# Patient Record
Sex: Male | Born: 1935 | Race: White | Hispanic: No | Marital: Married | Smoking: Former smoker
Health system: Southern US, Community
[De-identification: ages and names within clinical notes are randomized; demographics above are authoritative.]

## PROBLEM LIST (undated history)

## (undated) DIAGNOSIS — K219 Gastro-esophageal reflux disease without esophagitis: Secondary | ICD-10-CM

## (undated) DIAGNOSIS — J309 Allergic rhinitis, unspecified: Secondary | ICD-10-CM

## (undated) DIAGNOSIS — Z9079 Acquired absence of other genital organ(s): Secondary | ICD-10-CM

## (undated) DIAGNOSIS — R0602 Shortness of breath: Secondary | ICD-10-CM

## (undated) DIAGNOSIS — N400 Enlarged prostate without lower urinary tract symptoms: Secondary | ICD-10-CM

## (undated) DIAGNOSIS — N200 Calculus of kidney: Secondary | ICD-10-CM

## (undated) DIAGNOSIS — H269 Unspecified cataract: Secondary | ICD-10-CM

## (undated) DIAGNOSIS — R5383 Other fatigue: Secondary | ICD-10-CM

## (undated) DIAGNOSIS — E785 Hyperlipidemia, unspecified: Secondary | ICD-10-CM

## (undated) DIAGNOSIS — H9190 Unspecified hearing loss, unspecified ear: Secondary | ICD-10-CM

## (undated) DIAGNOSIS — E739 Lactose intolerance, unspecified: Secondary | ICD-10-CM

## (undated) DIAGNOSIS — R5381 Other malaise: Secondary | ICD-10-CM

## (undated) DIAGNOSIS — R350 Frequency of micturition: Secondary | ICD-10-CM

## (undated) DIAGNOSIS — N4340 Spermatocele of epididymis, unspecified: Secondary | ICD-10-CM

## (undated) DIAGNOSIS — R7302 Impaired glucose tolerance (oral): Secondary | ICD-10-CM

## (undated) DIAGNOSIS — K259 Gastric ulcer, unspecified as acute or chronic, without hemorrhage or perforation: Secondary | ICD-10-CM

## (undated) DIAGNOSIS — I700 Atherosclerosis of aorta: Secondary | ICD-10-CM

## (undated) DIAGNOSIS — T7840XA Allergy, unspecified, initial encounter: Secondary | ICD-10-CM

## (undated) DIAGNOSIS — F528 Other sexual dysfunction not due to a substance or known physiological condition: Secondary | ICD-10-CM

## (undated) DIAGNOSIS — Z923 Personal history of irradiation: Secondary | ICD-10-CM

## (undated) DIAGNOSIS — Z8719 Personal history of other diseases of the digestive system: Secondary | ICD-10-CM

## (undated) DIAGNOSIS — I10 Essential (primary) hypertension: Secondary | ICD-10-CM

## (undated) DIAGNOSIS — Z87442 Personal history of urinary calculi: Secondary | ICD-10-CM

## (undated) DIAGNOSIS — R0789 Other chest pain: Secondary | ICD-10-CM

## (undated) HISTORY — DX: Personal history of urinary calculi: Z87.442

## (undated) HISTORY — PX: INGUINAL HERNIA REPAIR: SUR1180

## (undated) HISTORY — DX: Essential (primary) hypertension: I10

## (undated) HISTORY — DX: Allergic rhinitis, unspecified: J30.9

## (undated) HISTORY — DX: Impaired glucose tolerance (oral): R73.02

## (undated) HISTORY — DX: Shortness of breath: R06.02

## (undated) HISTORY — DX: Lactose intolerance, unspecified: E73.9

## (undated) HISTORY — PX: TONSILLECTOMY: SUR1361

## (undated) HISTORY — PX: LITHOTRIPSY: SUR834

## (undated) HISTORY — DX: Benign prostatic hyperplasia without lower urinary tract symptoms: N40.0

## (undated) HISTORY — DX: Acquired absence of other genital organ(s): Z90.79

## (undated) HISTORY — PX: UPPER GI ENDOSCOPY: SHX6162

## (undated) HISTORY — DX: Other malaise: R53.81

## (undated) HISTORY — DX: Gastric ulcer, unspecified as acute or chronic, without hemorrhage or perforation: K25.9

## (undated) HISTORY — DX: Personal history of other diseases of the digestive system: Z87.19

## (undated) HISTORY — PX: SKIN LESION EXCISION: SHX2412

## (undated) HISTORY — DX: Spermatocele of epididymis, unspecified: N43.40

## (undated) HISTORY — PX: CYSTOSCOPY: SUR368

## (undated) HISTORY — DX: Other fatigue: R53.83

## (undated) HISTORY — DX: Hyperlipidemia, unspecified: E78.5

## (undated) HISTORY — DX: Other sexual dysfunction not due to a substance or known physiological condition: F52.8

## (undated) HISTORY — DX: Allergy, unspecified, initial encounter: T78.40XA

## (undated) HISTORY — DX: Frequency of micturition: R35.0

## (undated) HISTORY — PX: COLONOSCOPY: SHX174

## (undated) HISTORY — PX: OTHER SURGICAL HISTORY: SHX169

## (undated) HISTORY — DX: Calculus of kidney: N20.0

## (undated) HISTORY — DX: Gastro-esophageal reflux disease without esophagitis: K21.9

## (undated) HISTORY — DX: Other chest pain: R07.89

---

## 1998-09-26 ENCOUNTER — Ambulatory Visit (HOSPITAL_BASED_OUTPATIENT_CLINIC_OR_DEPARTMENT_OTHER): Admission: RE | Admit: 1998-09-26 | Discharge: 1998-09-26 | Payer: Self-pay | Admitting: Urology

## 2004-06-30 ENCOUNTER — Encounter (INDEPENDENT_AMBULATORY_CARE_PROVIDER_SITE_OTHER): Payer: Self-pay | Admitting: Specialist

## 2004-06-30 ENCOUNTER — Ambulatory Visit (HOSPITAL_COMMUNITY): Admission: RE | Admit: 2004-06-30 | Discharge: 2004-06-30 | Payer: Self-pay | Admitting: Urology

## 2004-06-30 ENCOUNTER — Ambulatory Visit (HOSPITAL_BASED_OUTPATIENT_CLINIC_OR_DEPARTMENT_OTHER): Admission: RE | Admit: 2004-06-30 | Discharge: 2004-06-30 | Payer: Self-pay | Admitting: Urology

## 2004-12-20 ENCOUNTER — Emergency Department (HOSPITAL_COMMUNITY): Admission: EM | Admit: 2004-12-20 | Discharge: 2004-12-20 | Payer: Self-pay | Admitting: Emergency Medicine

## 2005-09-28 ENCOUNTER — Ambulatory Visit (HOSPITAL_COMMUNITY): Admission: RE | Admit: 2005-09-28 | Discharge: 2005-09-28 | Payer: Self-pay | Admitting: Urology

## 2005-09-29 ENCOUNTER — Observation Stay (HOSPITAL_COMMUNITY): Admission: EM | Admit: 2005-09-29 | Discharge: 2005-09-30 | Payer: Self-pay | Admitting: *Deleted

## 2005-10-02 ENCOUNTER — Ambulatory Visit (HOSPITAL_COMMUNITY): Admission: RE | Admit: 2005-10-02 | Discharge: 2005-10-02 | Payer: Self-pay | Admitting: Urology

## 2005-10-02 ENCOUNTER — Ambulatory Visit (HOSPITAL_BASED_OUTPATIENT_CLINIC_OR_DEPARTMENT_OTHER): Admission: RE | Admit: 2005-10-02 | Discharge: 2005-10-02 | Payer: Self-pay | Admitting: Urology

## 2005-10-12 ENCOUNTER — Ambulatory Visit (HOSPITAL_COMMUNITY): Admission: RE | Admit: 2005-10-12 | Discharge: 2005-10-12 | Payer: Self-pay | Admitting: Urology

## 2006-04-20 ENCOUNTER — Ambulatory Visit: Payer: Self-pay | Admitting: Internal Medicine

## 2006-10-20 ENCOUNTER — Ambulatory Visit: Payer: Self-pay | Admitting: Internal Medicine

## 2006-10-20 LAB — CONVERTED CEMR LAB: PSA: 0.75 ng/mL

## 2006-11-12 ENCOUNTER — Ambulatory Visit (HOSPITAL_BASED_OUTPATIENT_CLINIC_OR_DEPARTMENT_OTHER): Admission: RE | Admit: 2006-11-12 | Discharge: 2006-11-12 | Payer: Self-pay | Admitting: Otolaryngology

## 2006-11-12 ENCOUNTER — Encounter (INDEPENDENT_AMBULATORY_CARE_PROVIDER_SITE_OTHER): Payer: Self-pay | Admitting: Specialist

## 2006-12-03 ENCOUNTER — Ambulatory Visit (HOSPITAL_BASED_OUTPATIENT_CLINIC_OR_DEPARTMENT_OTHER): Admission: RE | Admit: 2006-12-03 | Discharge: 2006-12-03 | Payer: Self-pay | Admitting: General Surgery

## 2006-12-03 ENCOUNTER — Encounter (INDEPENDENT_AMBULATORY_CARE_PROVIDER_SITE_OTHER): Payer: Self-pay | Admitting: Specialist

## 2007-04-18 ENCOUNTER — Ambulatory Visit: Payer: Self-pay | Admitting: Internal Medicine

## 2007-04-18 LAB — CONVERTED CEMR LAB
ALT: 16 units/L (ref 0–40)
AST: 17 units/L (ref 0–37)
Albumin: 3.9 g/dL (ref 3.5–5.2)
Alkaline Phosphatase: 91 units/L (ref 39–117)
Bilirubin, Direct: 0.2 mg/dL (ref 0.0–0.3)
Cholesterol: 172 mg/dL (ref 0–200)
HDL: 48.7 mg/dL (ref >39.0)
LDL Cholesterol: 113 mg/dL — ABNORMAL HIGH (ref 0–99)
Total Bilirubin: 0.9 mg/dL (ref 0.3–1.2)
Total CHOL/HDL Ratio: 3.5
Total Protein: 6.4 g/dL (ref 6.0–8.3)
Triglycerides: 51 mg/dL (ref 0–149)
VLDL: 10 mg/dL (ref 0–40)

## 2007-08-13 ENCOUNTER — Encounter: Payer: Self-pay | Admitting: Internal Medicine

## 2007-08-13 DIAGNOSIS — N200 Calculus of kidney: Secondary | ICD-10-CM | POA: Insufficient documentation

## 2007-08-13 DIAGNOSIS — Z8719 Personal history of other diseases of the digestive system: Secondary | ICD-10-CM

## 2007-08-13 DIAGNOSIS — T7840XA Allergy, unspecified, initial encounter: Secondary | ICD-10-CM | POA: Insufficient documentation

## 2007-08-13 DIAGNOSIS — N4340 Spermatocele of epididymis, unspecified: Secondary | ICD-10-CM

## 2007-08-13 DIAGNOSIS — I10 Essential (primary) hypertension: Secondary | ICD-10-CM

## 2007-08-13 DIAGNOSIS — K4020 Bilateral inguinal hernia, without obstruction or gangrene, not specified as recurrent: Secondary | ICD-10-CM | POA: Insufficient documentation

## 2007-08-13 HISTORY — DX: Personal history of other diseases of the digestive system: Z87.19

## 2007-08-13 HISTORY — DX: Essential (primary) hypertension: I10

## 2007-08-13 HISTORY — DX: Calculus of kidney: N20.0

## 2007-08-13 HISTORY — DX: Spermatocele of epididymis, unspecified: N43.40

## 2007-10-19 ENCOUNTER — Ambulatory Visit: Payer: Self-pay | Admitting: Internal Medicine

## 2007-10-19 DIAGNOSIS — J309 Allergic rhinitis, unspecified: Secondary | ICD-10-CM

## 2007-10-19 DIAGNOSIS — Z87442 Personal history of urinary calculi: Secondary | ICD-10-CM

## 2007-10-19 DIAGNOSIS — N400 Enlarged prostate without lower urinary tract symptoms: Secondary | ICD-10-CM

## 2007-10-19 DIAGNOSIS — F528 Other sexual dysfunction not due to a substance or known physiological condition: Secondary | ICD-10-CM | POA: Insufficient documentation

## 2007-10-19 DIAGNOSIS — R5383 Other fatigue: Secondary | ICD-10-CM

## 2007-10-19 DIAGNOSIS — E739 Lactose intolerance, unspecified: Secondary | ICD-10-CM

## 2007-10-19 DIAGNOSIS — Z9079 Acquired absence of other genital organ(s): Secondary | ICD-10-CM

## 2007-10-19 DIAGNOSIS — E785 Hyperlipidemia, unspecified: Secondary | ICD-10-CM

## 2007-10-19 DIAGNOSIS — R5381 Other malaise: Secondary | ICD-10-CM

## 2007-10-19 DIAGNOSIS — K219 Gastro-esophageal reflux disease without esophagitis: Secondary | ICD-10-CM | POA: Insufficient documentation

## 2007-10-19 DIAGNOSIS — N138 Other obstructive and reflux uropathy: Secondary | ICD-10-CM | POA: Insufficient documentation

## 2007-10-19 HISTORY — DX: Gastro-esophageal reflux disease without esophagitis: K21.9

## 2007-10-19 HISTORY — DX: Acquired absence of other genital organ(s): Z90.79

## 2007-10-19 HISTORY — DX: Personal history of urinary calculi: Z87.442

## 2007-10-19 HISTORY — DX: Hyperlipidemia, unspecified: E78.5

## 2007-10-19 HISTORY — DX: Other sexual dysfunction not due to a substance or known physiological condition: F52.8

## 2007-10-19 HISTORY — DX: Other malaise: R53.81

## 2007-10-19 HISTORY — DX: Lactose intolerance, unspecified: E73.9

## 2007-10-19 HISTORY — DX: Allergic rhinitis, unspecified: J30.9

## 2007-10-19 HISTORY — DX: Benign prostatic hyperplasia without lower urinary tract symptoms: N40.0

## 2007-10-21 LAB — CONVERTED CEMR LAB
ALT: 16 units/L (ref 0–53)
AST: 16 units/L (ref 0–37)
Albumin: 3.8 g/dL (ref 3.5–5.2)
Alkaline Phosphatase: 88 units/L (ref 39–117)
BUN: 17 mg/dL (ref 6–23)
Basophils Absolute: 0.1 10*3/uL (ref 0.0–0.1)
Basophils Relative: 1.3 % — ABNORMAL HIGH (ref 0.0–1.0)
Bilirubin, Direct: 0.1 mg/dL (ref 0.0–0.3)
CO2: 33 meq/L — ABNORMAL HIGH (ref 19–32)
Calcium: 9.2 mg/dL (ref 8.4–10.5)
Chloride: 105 meq/L (ref 96–112)
Cholesterol: 203 mg/dL (ref 0–200)
Creatinine, Ser: 0.7 mg/dL (ref 0.4–1.5)
Direct LDL: 128.8 mg/dL
Eosinophils Absolute: 0.2 10*3/uL (ref 0.0–0.6)
Eosinophils Relative: 3.6 % (ref 0.0–5.0)
GFR calc Af Amer: 144 mL/min
GFR calc non Af Amer: 119 mL/min
Glucose, Bld: 89 mg/dL (ref 70–99)
HCT: 40.9 % (ref 39.0–52.0)
HDL: 56.7 mg/dL (ref >39.0)
Hemoglobin: 14.5 g/dL (ref 13.0–17.0)
Hgb A1c MFr Bld: 4.9 % (ref 4.6–6.0)
Lymphocytes Relative: 25.6 % (ref 12.0–46.0)
MCHC: 35.4 g/dL (ref 30.0–36.0)
MCV: 90.1 fL (ref 78.0–100.0)
Monocytes Absolute: 0.4 10*3/uL (ref 0.2–0.7)
Monocytes Relative: 7.5 % (ref 3.0–11.0)
Neutro Abs: 3 10*3/uL (ref 1.4–7.7)
Neutrophils Relative %: 62 % (ref 43.0–77.0)
PSA: 0.91 ng/mL (ref 0.10–4.00)
Platelets: 197 10*3/uL (ref 150–400)
Potassium: 4.1 meq/L (ref 3.5–5.1)
RBC: 4.54 M/uL (ref 4.22–5.81)
RDW: 12.5 % (ref 11.5–14.6)
Sodium: 144 meq/L (ref 135–145)
TSH: 1.41 microintl units/mL (ref 0.35–5.50)
Testosterone: 339.48 ng/dL — ABNORMAL LOW (ref 350.00–890)
Total Bilirubin: 0.9 mg/dL (ref 0.3–1.2)
Total CHOL/HDL Ratio: 3.6
Total Protein: 6.4 g/dL (ref 6.0–8.3)
Triglycerides: 52 mg/dL (ref 0–149)
VLDL: 10 mg/dL (ref 0–40)
WBC: 5 10*3/uL (ref 4.5–10.5)

## 2008-04-17 ENCOUNTER — Ambulatory Visit: Payer: Self-pay | Admitting: Internal Medicine

## 2008-05-29 ENCOUNTER — Ambulatory Visit: Payer: Self-pay | Admitting: Internal Medicine

## 2008-06-04 LAB — CONVERTED CEMR LAB
Cholesterol: 173 mg/dL (ref 0–200)
HDL: 46 mg/dL (ref >39.0)
VLDL: 17 mg/dL (ref 0–40)

## 2008-11-20 ENCOUNTER — Ambulatory Visit: Payer: Self-pay | Admitting: Internal Medicine

## 2008-11-20 DIAGNOSIS — R350 Frequency of micturition: Secondary | ICD-10-CM

## 2008-11-20 DIAGNOSIS — R0602 Shortness of breath: Secondary | ICD-10-CM

## 2008-11-20 HISTORY — DX: Frequency of micturition: R35.0

## 2008-11-20 HISTORY — DX: Shortness of breath: R06.02

## 2008-11-20 LAB — CONVERTED CEMR LAB
ALT: 17 units/L (ref 0–53)
AST: 18 units/L (ref 0–37)
Albumin: 4.1 g/dL (ref 3.5–5.2)
Alkaline Phosphatase: 108 units/L (ref 39–117)
BUN: 13 mg/dL (ref 6–23)
Bacteria, UA: NEGATIVE
Basophils Absolute: 0.1 10*3/uL (ref 0.0–0.1)
Basophils Relative: 0.9 % (ref 0.0–3.0)
Bilirubin Urine: NEGATIVE
Bilirubin, Direct: 0.2 mg/dL (ref 0.0–0.3)
CO2: 33 meq/L — ABNORMAL HIGH (ref 19–32)
Calcium: 9.3 mg/dL (ref 8.4–10.5)
Chloride: 107 meq/L (ref 96–112)
Cholesterol: 173 mg/dL (ref 0–200)
Creatinine, Ser: 0.9 mg/dL (ref 0.4–1.5)
Crystals: NEGATIVE
Eosinophils Absolute: 0.1 10*3/uL (ref 0.0–0.7)
Eosinophils Relative: 2 % (ref 0.0–5.0)
Folate: 19.4 ng/mL
GFR calc Af Amer: 107 mL/min
GFR calc non Af Amer: 89 mL/min
Glucose, Bld: 93 mg/dL (ref 70–99)
HCT: 42 % (ref 39.0–52.0)
HDL: 60.5 mg/dL (ref >39.0)
Hemoglobin, Urine: NEGATIVE
Hemoglobin: 14.8 g/dL (ref 13.0–17.0)
Ketones, ur: NEGATIVE mg/dL
LDL Cholesterol: 101 mg/dL — ABNORMAL HIGH (ref 0–99)
Leukocytes, UA: NEGATIVE
Lymphocytes Relative: 19.9 % (ref 12.0–46.0)
MCHC: 35.1 g/dL (ref 30.0–36.0)
MCV: 91.1 fL (ref 78.0–100.0)
Monocytes Absolute: 0.5 10*3/uL (ref 0.1–1.0)
Monocytes Relative: 7.5 % (ref 3.0–12.0)
Mucus, UA: NEGATIVE
Neutro Abs: 4.1 10*3/uL (ref 1.4–7.7)
Neutrophils Relative %: 69.7 % (ref 43.0–77.0)
Nitrite: NEGATIVE
PSA: 0.91 ng/mL (ref 0.10–4.00)
Platelets: 185 10*3/uL (ref 150–400)
Potassium: 4 meq/L (ref 3.5–5.1)
RBC / HPF: NONE SEEN
RBC: 4.61 M/uL (ref 4.22–5.81)
RDW: 12.6 % (ref 11.5–14.6)
Sed Rate: 3 mm/hr (ref 0–16)
Sodium: 144 meq/L (ref 135–145)
Specific Gravity, Urine: 1.01 (ref 1.000–1.03)
Squamous Epithelial / HPF: NEGATIVE /lpf
TSH: 1.82 microintl units/mL (ref 0.35–5.50)
Total Bilirubin: 0.9 mg/dL (ref 0.3–1.2)
Total CHOL/HDL Ratio: 2.9
Total Protein, Urine: NEGATIVE mg/dL
Total Protein: 6.7 g/dL (ref 6.0–8.3)
Triglycerides: 56 mg/dL (ref 0–149)
Urine Glucose: NEGATIVE mg/dL
Urobilinogen, UA: 0.2 (ref 0.0–1.0)
VLDL: 11 mg/dL (ref 0–40)
Vitamin B-12: 274 pg/mL (ref 211–911)
WBC: 6 10*3/uL (ref 4.5–10.5)
pH: 7 (ref 5.0–8.0)

## 2008-11-21 LAB — CONVERTED CEMR LAB: Vit D, 1,25-Dihydroxy: 32 (ref 30–89)

## 2008-12-03 ENCOUNTER — Encounter: Admission: RE | Admit: 2008-12-03 | Discharge: 2008-12-03 | Payer: Self-pay | Admitting: Pulmonary Disease

## 2008-12-03 ENCOUNTER — Ambulatory Visit: Payer: Self-pay | Admitting: Pulmonary Disease

## 2008-12-03 DIAGNOSIS — R0789 Other chest pain: Secondary | ICD-10-CM | POA: Insufficient documentation

## 2008-12-03 HISTORY — DX: Other chest pain: R07.89

## 2008-12-05 ENCOUNTER — Telehealth (INDEPENDENT_AMBULATORY_CARE_PROVIDER_SITE_OTHER): Payer: Self-pay | Admitting: *Deleted

## 2008-12-12 ENCOUNTER — Ambulatory Visit (HOSPITAL_COMMUNITY): Admission: RE | Admit: 2008-12-12 | Discharge: 2008-12-12 | Payer: Self-pay | Admitting: Pulmonary Disease

## 2008-12-14 ENCOUNTER — Telehealth (INDEPENDENT_AMBULATORY_CARE_PROVIDER_SITE_OTHER): Payer: Self-pay | Admitting: *Deleted

## 2008-12-20 ENCOUNTER — Ambulatory Visit: Payer: Self-pay | Admitting: Pulmonary Disease

## 2009-06-03 ENCOUNTER — Ambulatory Visit: Payer: Self-pay | Admitting: Internal Medicine

## 2009-11-12 ENCOUNTER — Telehealth: Payer: Self-pay | Admitting: Internal Medicine

## 2010-03-27 ENCOUNTER — Ambulatory Visit: Payer: Self-pay | Admitting: Internal Medicine

## 2010-12-28 ENCOUNTER — Encounter: Payer: Self-pay | Admitting: Pulmonary Disease

## 2011-04-24 NOTE — H&P (Signed)
NAMELYALL, Connor NO.:  Morgan   MEDICAL RECORD NO.:  [id number]          PATIENT TYPE:  OBV   LOCATION:  1422                         FACILITY:  [facility]   PHYSICIAN:  Connor Morgan, Connor MorganDATE OF BIRTH:  1936/01/25   DATE OF ADMISSION:  09/29/2005  DATE OF DISCHARGE:                                HISTORY & PHYSICAL   HISTORY OF PRESENT ILLNESS:  This 75 year old white male underwent  lithotripsy for an 11 mm right renal stone on September 28, 2005.  He went  home that day and came back to the emergency room because he was having the  constant urge to urinate, uncontrolled nausea, and significant right flank  pain.  He was having no hematuria or fever.  After he came to the emergency  room, a Foley catheter was inserted, although there was not much in the  bladder, and it was left in.  The Foley catheter was removed at noon today,  but he is still having small voidings and needs continued evaluation to make  sure he has not gone into retention.  KUB shows a 6 mm stone fragment in the  right renal pelvis, with some smaller stony fragments.  When he was home, he  had severe nausea, and it is unclear whether it was due to Tylox or not.   PAST MEDICAL HISTORY:  1.  Hypertension.  2.  Acid reflux.  3.  History of some voiding dysfunction.  4.  Hypercholesterolemia.   PREVIOUS SURGERIES:  An orchiectomy.   MEDICATIONS:  1.  Lisinopril.  2.  __________.  3.  Septra DS.  4.  Simvastatin.  5.  Terazosin 1 mg at bedtime.  6.  Aspirin 81 mg.  7.  He was on Tylox for pain.   ALLERGIES TO DRUGS:  None known, except for a question of nausea with  OXYCODONE, which I think is questionable.  He does not smoke or drink  alcohol.   FAMILY HISTORY:  Noncontributory.   REVIEW OF SYSTEMS:  Noted on health history and initialed by me.   PHYSICAL EXAMINATION:  VITAL SIGNS:  Blood pressure on admission was 126/81,  pulse 69, temperature 97.2.  GENERAL:  He is alert  and oriented.  SKIN:  Warm and dry.  He seems to be in minimal distress at this time.  LUNGS:  No respiratory distress.  HEART:  Heart tones regular.  No abdominal or CVA tenderness.  GU:  Foley catheter was in place, but is now removed.  Absent testicle.  RECTAL:  Deferred.   IMPRESSION:  1.  Right flank pain, nausea, and increased urge to void post lithotripsy of      right renal calculus.  2.  Gastroesophageal reflux disease.  3.  Hypercholesterolemia.  4.  Hypertension.   PLAN:  In and out catheterization p.r.n.  Monitor with bladder scan as  needed.  Pain medications as needed.  Pain control.  We will also strain his  urine.      Connor Morgan, M.D.  Electronically Signed     LJP/MEDQ  D:  09/29/2005  T:  09/29/2005  Job:  [id number]

## 2011-04-24 NOTE — Op Note (Signed)
NAME:  Connor Morgan, Connor Morgan                          ACCOUNT NO.:  [id number]   MEDICAL RECORD NO.:  [id number]                   PATIENT TYPE:  AMB   LOCATION:  NESC                                 FACILITY:  [facility]   PHYSICIAN:  Maretta Bees. Vonita Pesci, M.D.             DATE OF BIRTH:  06-Dec-1936   DATE OF PROCEDURE:  06/30/2004  DATE OF DISCHARGE:                                 OPERATIVE REPORT   PREOPERATIVE DIAGNOSIS:  Right testicular mass of uncertain nature.   POSTOPERATIVE DIAGNOSIS:  Right testicular mass of uncertain nature.   PROCEDURE:  Right radical orchiectomy.   SURGEON:  Maretta Bees. Vonita Martian, M.D.   ANESTHESIA:  General.   INDICATIONS:  This 75 year old white male underwent a scrotal ultrasound at  the [facility], which showed a minimal hydrocele on the left and bilateral  epididymal cysts.  There are spermatoceles and an abnormal lesion in the  right mid testicle measuring 35 mm in size, and the radiologist at the [facility]  was concerned about it being a solid tumor.  I saw him in the office and  showed the film to my partners, who thought it might be a hemangioma, which  I was wondering about, but Doppler ultrasound did not show any blood flow  consistent with that.  It was therefore a lesion of uncertain etiology.  Fortunately, he had a beta HCG tumor marker, which was less than 2, and a  serum AFB, which was 4.7, which was normal.  I told the patient I was not  sure what this lesion was, but it certainly could be some type of  malignancy, despite negative tumor markers, and that unusual tumors would be  more common in somebody of his age.  We discussed the fact that right  radical orchiectomy would be the normal procedure if we knew that this was a  malignancy and the patient felt that he wanted this done, even if the lesion  turned out to be benign; therefore, he is brought to the OR today.   PROCEDURE:  The patient is brought to the operating room and placed in  supine  position.  The lower abdomen and external genitalia were prepped and  draped in the usual fashion.  A inguinal incision on the right just above  the scrotum was made along the length of the cord.  Some subcu bleeders were  coagulated.  The cord was easily identified and mobilized and a Penrose tape  placed around it.  The testicles were then brought up into the wound, and  attachments of the testicle to the scrotum were divided with electrocautery  without injury to the scrotal skin or wall.  The testicle was mobilized up  to the external ring, and the vas was separated from the cord and divided  and ligated with 0 black silk.  The vascular structures and the rest of the  cord structures were then divided  and ligated proximally with 0 black silk,  and then distally on the stump of the cord I used 2-0 black silk suture  ligature.  This provided excellent hemostasis, and the cord retracted into  the external ring.  The testicle was sent to pathology.  There is no scrotal  bleeding.  The incision was then closed with interrupted 3-0 chromic cat gut  in the subcu after injecting the wound with 0.5% plain Marcaine.  The skin  was closed with skin staples.  A dry sterile gauze dressing was applied.  The patient was sent to the recovery room in good condition, having  tolerating the procedure well with essentially no blood and correct sponge,  needle, and instrument counts.                                              Maretta Bees. Vonita Verhagen, M.D.   LJP/MEDQ  D:  06/30/2004  T:  06/30/2004  Job:  [id number]

## 2011-04-24 NOTE — Op Note (Signed)
NAMESLOAN, TAKAGI                ACCOUNT NO.:  [id number]   MEDICAL RECORD NO.:  [id number]          PATIENT TYPE:  AMB   LOCATION:  NESC                         FACILITY:  [facility]   PHYSICIAN:  Maretta Bees. Vonita Quigg, M.D.DATE OF BIRTH:  1936/06/07   DATE OF PROCEDURE:  10/02/2005  DATE OF DISCHARGE:                                 OPERATIVE REPORT   PREOPERATIVE DIAGNOSIS:  Right flank pain, nausea and vomiting due to right  ureteral stone fragments.   POSTOPERATIVE DIAGNOSIS:  Right flank pain, nausea and vomiting due to right  ureteral stone fragments.   PROCEDURE:  Cystoscopy, right retrograde pyelogram with interpretation and  right double-J catheter placement.   SURGEON:  Maretta Bees. Vonita Priestly, M.D.   ANESTHESIA:  General.   INDICATIONS:  This gentleman underwent lithotripsy earlier this week and has  had a difficult time with flank pain, nausea.  He wished placement of a  double-J catheter today to relieve his flank pain and discomfort.  He was  advised about the risks of bleeding, bladder irritability, infection, or  perforation.   PROCEDURE:  The patient was brought to the operating room and placed in the  lithotomy position.  External genitalia prepped and draped in the usual  fashion.  He was cystoscoped, and the bladder was unremarkable.  A guidewire  was placed up the right ureter, and it went past the stones at the level of  L3 without any difficulty.  An open-ended ureteral catheter, which was 5  [place] in size, was passed over the guidewire.  A right retrograde pyelogram  was obtained showing some mild pyelocaliectasis but no filling defects or  other abnormalities.  The guidewire was reinserted through the ureteral  catheter, and over the guidewire, a 6 French 26 cm double-J catheter was  placed without difficulty with a full coil in the renal pelvis, a full coil  in the bladder and the string brought out per the urethra.  The bladder was  emptied, the scope removed,  and the patient sent to the recovery room in  good condition after taping the string from the double-J catheter to the  dorsum of the penis.  He tolerated the procedure well.      Maretta Bees. Vonita Richer, M.D.  Electronically Signed     LJP/MEDQ  D:  10/02/2005  T:  10/02/2005  Job:  [id number]

## 2011-04-24 NOTE — Discharge Summary (Signed)
NAMEKRISHANG, READING NO.:  [id number]   MEDICAL RECORD NO.:  [id number]          PATIENT TYPE:  OBV   LOCATION:  1422                         FACILITY:  [facility]   PHYSICIAN:  Maretta Bees. Vonita Corti, M.D.DATE OF BIRTH:  05/22/1936   DATE OF ADMISSION:  09/28/2005  DATE OF DISCHARGE:  09/30/2005                                 DISCHARGE SUMMARY   FINAL DIAGNOSES:  1.  Right renal stone fragments post lithotripsy.  2.  Right flank pain, due to #1.  3.  Nausea, due to #1.  4.  Gastroesophageal reflux disease.  5.  Hypercholesterolemia.  6.  Hypertension.   PROCEDURES:  None.   HISTORY:  This gentleman was admitted because of nausea, pain and trouble  voiding after lithotripsy.   PHYSICAL EXAMINATION:  He was noted to be uncomfortable and in distress with  a Foley catheter in place that was put in in the emergency room.   HOSPITAL COURSE:  After admission, he had parenteral antiemetics and pain  medication given that gave him good relief.  The Foley was removed, and he  voided well after that.  He remained afebrile and was ready for discharge on  October 25.  He will go home on activity as tolerated.  Return to the office  in two weeks in followup.  He was sent home in improved condition.   DISCHARGE MEDICATIONS:  Included oxycodone for pain, and he will come by the  office and pick up some Flomax to help facilitate stone passage, but he will  also continue his very low dose of 1 mg of terazosin in addition to his  other regular medications, including lisinopril, Protonix, Septra,  simvastatin and low-dose aspirin.      Maretta Bees. Vonita Giannini, M.D.  Electronically Signed     LJP/MEDQ  D:  09/30/2005  T:  09/30/2005  Job:  [id number]

## 2011-04-24 NOTE — Op Note (Signed)
NAMERONIT, MARCZAK                ACCOUNT NO.:  [id number]   MEDICAL RECORD NO.:  [id number]          PATIENT TYPE:  AMB   LOCATION:  DSC                          FACILITY:  [facility]   PHYSICIAN:  Christopher E. Ezzard Standing, M.D.DATE OF BIRTH:  [birth year]   DATE OF PROCEDURE:  11/12/2006  DATE OF DISCHARGE:                               OPERATIVE REPORT   PREOPERATIVE DIAGNOSES:  1. 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  2. Sinonasal polyps with nasal obstruction.  3. Moderate turbinate hypertrophy.   POSTOPERATIVE DIAGNOSES:  1. 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  2. Sinonasal polyps with nasal obstruction.  3. Moderate turbinate hypertrophy.   PROCEDURE:  Septoplasty with simple inferior turbinate reductions.  Functional endoscopic sinus surgery with bilateral ethmoidectomy with  removal of ethmoid polyps, bilateral maxillary ostial enlargement  removal of polyps.   SURGEON:  Kristine Garbe. Ezzard Standing, M.D.   ANESTHESIA:  General endotracheal.   COMPLICATIONS:  None.   CLINICAL NOTE:  Connor Morgan is a 75 year old gentleman who has had  longstanding left-sided nasal obstruction.  Over the past year it has  gradually gotten worse.  When he lies down, his right side of nose  becomes obstructed.  On exam, he has a severely deviated septum  anteriorly to the left, most likely from trauma when he was a youth.  In  addition, he has sinonasal polyps, especially very large polyp on the  right side which obstructs his airway when he lies down.  He is taken to  the operating room at this time for septoplasty and removal of sinonasal  polyps.   DESCRIPTION OF PROCEDURE:  After adequate endotracheal anesthesia, the  nose was prepped with Betadine solution and draped out with sterile  towels.  The septum was then injected with Xylocaine with epinephrine.  The nose was further prepped with cotton pledgets soaked in  decongestant.  The  patient received 1 gram of Ancef IV preoperatively.   A hemitransfixion incision was made along the septum on the right side.  Mucoperichondrial and mucoperiosteal flaps were elevated posteriorly on  both sides anteriorly.  The patient had a very severe buckling of the  anterior cartilaginous septum where it appears that he has had an old  fracture of the cartilaginous septum.  A portion that had buckled into  the left airway was removed.  In addition, more posteriorly he had a  septal spur on the left side which was removed.  This completed the  septoplasty portion of procedure.  The septum was closed with interrupted 4-0 chromic suture, and the  septum was basted with a 4-0 chromic suture.   Next, the ethmoid area was approached on the right side.  The patient  had a large polyp extruding from the right middle meatus area which was  removed.  This was obstructing the posterior nasal cavity almost  totally.  There were several smaller polyps around the maxillary ostia  and in the ethmoid area that were removed with a microdebrider.  The  maxillary ostia was enlarged with backbiting straight through-cut  forceps.  On visualization of the maxillary mucosa, there were some very  early polypoid changes of the mucosa but no real large polyps within the  maxillary sinus itself.   Next, the left side was approached.  There were several smaller polyps  on the left middle meatus.  Again, the uncinate process was incised and  removed with a backbiter.  The maxillary ostia had several polyps  obstructing the ostia which were removed, and the ostia was enlarged  with backbiting straight through-cut forceps.  On looking in the left  maxillary sinus with a 30-degree scope, again there were some early  polypoid changes but no significant polyps within the sinus itself.  There were several smaller polyps within the ethmoid area that were  removed with a microdebrider.  He also had a few polyps  extending from  the medial aspect of the middle turbinate posteriorly from the posterior  ethmoid area.  These were removed with cup forceps.  This completed the  removal of sinonasal polyps.  At the end of the procedure, submucosal  bipolar cauterization of the inferior turbinates was performed, and the  turbinates were outfractured.  A Silastic splint was secured to each  side of the septum with a single 3-0 nylon suture.  Kennedy sinus packs  were placed within the ethmoid areas bilaterally, and Telfa soaked in  bacitracin ointment was placed in the nose bilaterally.  This was  secured anteriorly with a 2-0 silk suture which was tied outside the  nose.  This completed procedure.  Nasario was awakened from anesthesia  and transferred to the recovery room postoperatively doing well.   DISPOSITION:  Camdin will be discharged home later this morning on  Keflex 5 mg b.i.d. for 1 week, Tylenol and Vicodin p.r.n. pain.  He will  remove his nasal packs tomorrow morning at home and will follow up in my  office in 4 days for recheck and removal of the [facility] ethmoid packs.           ______________________________  Kristine Garbe Ezzard Standing, M.D.     CEN/MEDQ  D:  11/12/2006  T:  11/12/2006  Job:  [id number]   cc:   Corwin Levins, MD

## 2011-07-07 DIAGNOSIS — K4020 Bilateral inguinal hernia, without obstruction or gangrene, not specified as recurrent: Secondary | ICD-10-CM

## 2011-07-08 ENCOUNTER — Other Ambulatory Visit (INDEPENDENT_AMBULATORY_CARE_PROVIDER_SITE_OTHER): Payer: Medicare Other

## 2011-07-08 ENCOUNTER — Encounter: Payer: Self-pay | Admitting: Internal Medicine

## 2011-07-08 ENCOUNTER — Ambulatory Visit (INDEPENDENT_AMBULATORY_CARE_PROVIDER_SITE_OTHER): Payer: Medicare Other | Admitting: Internal Medicine

## 2011-07-08 VITALS — BP 162/88 | HR 72 | Temp 98.4°F | Ht 72.0 in | Wt 172.1 lb

## 2011-07-08 DIAGNOSIS — R7302 Impaired glucose tolerance (oral): Secondary | ICD-10-CM

## 2011-07-08 DIAGNOSIS — R5383 Other fatigue: Secondary | ICD-10-CM

## 2011-07-08 DIAGNOSIS — N320 Bladder-neck obstruction: Secondary | ICD-10-CM

## 2011-07-08 DIAGNOSIS — R7309 Other abnormal glucose: Secondary | ICD-10-CM

## 2011-07-08 DIAGNOSIS — R5381 Other malaise: Secondary | ICD-10-CM

## 2011-07-08 DIAGNOSIS — I10 Essential (primary) hypertension: Secondary | ICD-10-CM

## 2011-07-08 DIAGNOSIS — Z0000 Encounter for general adult medical examination without abnormal findings: Secondary | ICD-10-CM | POA: Insufficient documentation

## 2011-07-08 DIAGNOSIS — E785 Hyperlipidemia, unspecified: Secondary | ICD-10-CM

## 2011-07-08 HISTORY — DX: Impaired glucose tolerance (oral): R73.02

## 2011-07-08 LAB — BASIC METABOLIC PANEL
BUN: 20 mg/dL (ref 6–23)
CO2: 29 mEq/L (ref 19–32)
Calcium: 8.9 mg/dL (ref 8.4–10.5)
Chloride: 104 mEq/L (ref 96–112)
Creatinine, Ser: 0.9 mg/dL (ref 0.4–1.5)
GFR: 88.96 mL/min (ref >60.00)
Glucose, Bld: 73 mg/dL (ref 70–99)
Potassium: 4.1 mEq/L (ref 3.5–5.1)
Sodium: 142 mEq/L (ref 135–145)

## 2011-07-08 LAB — CBC WITH DIFFERENTIAL/PLATELET
Basophils Relative: 0.9 % (ref 0.0–3.0)
Eosinophils Relative: 3.3 % (ref 0.0–5.0)
HCT: 40.9 % (ref 39.0–52.0)
Hemoglobin: 14.2 g/dL (ref 13.0–17.0)
Lymphs Abs: 1.8 10*3/uL (ref 0.7–4.0)
MCV: 91.7 fl (ref 78.0–100.0)
Monocytes Absolute: 0.5 10*3/uL (ref 0.1–1.0)
Monocytes Relative: 7.8 % (ref 3.0–12.0)
Neutro Abs: 3.3 10*3/uL (ref 1.4–7.7)
Neutrophils Relative %: 56.7 % (ref 43.0–77.0)
Platelets: 180 10*3/uL (ref 150.0–400.0)
RBC: 4.46 Mil/uL (ref 4.22–5.81)
WBC: 5.9 10*3/uL (ref 4.5–10.5)

## 2011-07-08 LAB — URINALYSIS, ROUTINE W REFLEX MICROSCOPIC
Bilirubin Urine: NEGATIVE
Ketones, ur: NEGATIVE
Leukocytes, UA: NEGATIVE
Nitrite: NEGATIVE
Specific Gravity, Urine: 1.02 (ref 1.000–1.030)
Total Protein, Urine: NEGATIVE
Urine Glucose: NEGATIVE
Urobilinogen, UA: 0.2 (ref 0.0–1.0)
pH: 7.5 (ref 5.0–8.0)

## 2011-07-08 LAB — HEMOGLOBIN A1C: Hgb A1c MFr Bld: 5.1 % (ref 4.6–6.5)

## 2011-07-08 LAB — PSA: PSA: 1.21 ng/mL (ref 0.10–4.00)

## 2011-07-08 LAB — TSH: TSH: 1.45 u[IU]/mL (ref 0.35–5.50)

## 2011-07-08 MED ORDER — ATORVASTATIN CALCIUM 10 MG PO TABS: 10.0000 mg | ORAL_TABLET | Freq: Every day | ORAL | Status: DC

## 2011-07-08 MED ORDER — BENAZEPRIL HCL 10 MG PO TABS: 10.0000 mg | ORAL_TABLET | Freq: Every day | ORAL | Status: DC

## 2011-07-08 MED ORDER — AMLODIPINE BESYLATE 5 MG PO TABS: 5.0000 mg | ORAL_TABLET | Freq: Every day | ORAL | Status: DC

## 2011-07-08 NOTE — Progress Notes (Signed)
Subjective:    Patient ID: Connor Morgan, male    DOB: 1936/03/14, 75 y.o.   MRN: [id number]  HPI  Here for f/u - last seen June 2010, out of all meds, due to wife's illness and taking care of her per pt;  Overall doing ok;  Pt denies CP, worsening SOB, DOE, wheezing, orthopnea, PND, worsening LE edema, palpitations, dizziness or syncope.  Pt denies neurological change such as new Headache, facial or extremity weakness.  Pt denies polydipsia, polyuria, or low sugar symptoms. Pt states overall good compliance with treatment and medications when he had thenm, good tolerability, and trying to follow lower cholesterol diet.  Pt denies worsening depressive symptoms, suicidal ideation or panic. No fever, wt loss, night sweats, loss of appetite, or other constitutional symptoms.  Pt states good ability with ADL's, low fall risk, home safety reviewed and adequate, no significant changes in hearing or vision, and occasionally active with exercise. Does have sense of ongoing fatigue, but denies signficant hypersomnolence.  Past Medical History  Diagnosis Date  . ALLERGIC RHINITIS 10/19/2007  . ALLERGY 08/13/2007  . BENIGN PROSTATIC HYPERTROPHY 10/19/2007  . CHEST PAIN, ATYPICAL 12/03/2008  . DYSPNEA 11/20/2008  . ERECTILE DYSFUNCTION 10/19/2007  . FATIGUE 10/19/2007  . FREQUENCY, URINARY 11/20/2008  . GERD 10/19/2007  . GLUCOSE INTOLERANCE 10/19/2007  . HEMORRHOIDS, HX OF 08/13/2007  . HYPERLIPIDEMIA 10/19/2007  . HYPERTENSION 08/13/2007  . NEPHROLITHIASIS, HX OF 10/19/2007  . NEPHROLITHIASIS 08/13/2007  . ORCHIECTOMY, HX OF 10/19/2007  . Spermatocele 08/13/2007  . Impaired glucose tolerance 07/08/2011   Past Surgical History  Procedure Date  . S/p right orchiectomy     benign mass, right  . Inguinal herniorrhapy\     reports that he has quit smoking. He does not have any smokeless tobacco history on file. He reports that he does not drink alcohol. His drug history not on file. family history includes  Dementia in his father and mother and Heart disease in his father. Allergies  Allergen Reactions  . Lovastatin     REACTION: myalgias   Current Outpatient Prescriptions on File Prior to Visit  Medication Sig Dispense Refill  . aspirin 81 MG tablet Take 81 mg by mouth daily.         Review of Systems Review of Systems  Constitutional: Negative for diaphoresis and unexpected weight change.  HENT: Negative for drooling and tinnitus.   Eyes: Negative for photophobia and visual disturbance.  Respiratory: Negative for choking and stridor.   Gastrointestinal: Negative for vomiting and blood in stool.  Genitourinary: Negative for hematuria and decreased urine volume.      Objective:   Physical Exam BP 162/88  Pulse 72  Temp(Src) 98.4 F (36.9 C) (Oral)  Ht 6' (1.829 m)  Wt 172 lb 2 oz (78.075 kg)  BMI 23.34 kg/m2  SpO2 96% Physical Exam  VS noted Constitutional: Pt is oriented to person, place, and time. Appears well-developed and well-nourished.  HENT:  Head: Normocephalic and atraumatic.  Right Ear: External ear normal.  Left Ear: External ear normal.  Nose: Nose normal.  Mouth/Throat: Oropharynx is clear and moist.  Eyes: Conjunctivae and EOM are normal. Pupils are equal, round, and reactive to light.  Neck: Normal range of motion. Neck supple. No JVD present. No tracheal deviation present.  Cardiovascular: Normal rate, regular rhythm, normal heart sounds and intact distal pulses.   Pulmonary/Chest: Effort normal and breath sounds normal.  Abdominal: Soft. Bowel sounds are normal. There is no tenderness.  Musculoskeletal: Normal range of motion. Exhibits no edema.  Lymphadenopathy:  Has no cervical adenopathy.  Neurological: Pt is alert and oriented to person, place, and time. Pt has normal reflexes. No cranial nerve deficit.  Skin: Skin is warm and dry. No rash noted.  Psychiatric:  Has  normal mood and affect. Behavior is normal.     Assessment & Plan:

## 2011-07-08 NOTE — Assessment & Plan Note (Signed)
Etiology unclear, Exam otherwise benign, to check labs as documented, follow with expectant management  

## 2011-07-08 NOTE — Assessment & Plan Note (Addendum)
stable overall by hx and exam, most recent data reviewed with pt, and pt to continue medical treatment as before - to re-start meds   BP Readings from Last 3 Encounters:  07/08/11 162/88  06/03/09 152/96  12/20/08 130/80

## 2011-07-08 NOTE — Assessment & Plan Note (Signed)
stable overall by hx and exam, most recent data reviewed with pt, and pt to continue medical treatment as before  Lab Results  Component Value Date   HGBA1C 4.9 10/19/2007

## 2011-07-08 NOTE — Patient Instructions (Signed)
Continue all other medications as before Please go to LAB in the Basement for the blood and/or urine tests to be done today Please call the phone number [phone] (the [employer]Tree System) for results of testing in 2-3 days;  When calling, simply dial the number, and when prompted enter the MRN number above (the Medical Record Number) and the # key, then the message should start.  

## 2011-07-08 NOTE — Assessment & Plan Note (Signed)
stable overall by hx and exam, most recent data reviewed with pt, and pt to continue medical treatment as before  Does not feel he needs the cardura

## 2011-07-08 NOTE — Assessment & Plan Note (Signed)
stable overall by hx and exam, most recent data reviewed with pt, and pt to continue medical treatment as before  Lab Results  Component Value Date   LDLCALC 101* 11/20/2008

## 2011-07-09 LAB — HEPATIC FUNCTION PANEL
ALT: 15 U/L (ref 0–53)
AST: 17 U/L (ref 0–37)
Albumin: 4.4 g/dL (ref 3.5–5.2)
Alkaline Phosphatase: 92 U/L (ref 39–117)
Bilirubin, Direct: 0.1 mg/dL (ref 0.0–0.3)
Total Bilirubin: 0.7 mg/dL (ref 0.3–1.2)

## 2011-07-28 ENCOUNTER — Ambulatory Visit: Payer: Self-pay | Admitting: Internal Medicine

## 2012-04-23 DIAGNOSIS — E7800 Pure hypercholesterolemia, unspecified: Secondary | ICD-10-CM | POA: Diagnosis not present

## 2012-04-23 DIAGNOSIS — I10 Essential (primary) hypertension: Secondary | ICD-10-CM | POA: Diagnosis not present

## 2012-04-23 DIAGNOSIS — R55 Syncope and collapse: Secondary | ICD-10-CM | POA: Diagnosis not present

## 2012-04-23 DIAGNOSIS — R197 Diarrhea, unspecified: Secondary | ICD-10-CM | POA: Diagnosis not present

## 2012-04-23 DIAGNOSIS — R42 Dizziness and giddiness: Secondary | ICD-10-CM | POA: Diagnosis not present

## 2012-04-23 DIAGNOSIS — R112 Nausea with vomiting, unspecified: Secondary | ICD-10-CM | POA: Diagnosis not present

## 2012-04-23 DIAGNOSIS — R1110 Vomiting, unspecified: Secondary | ICD-10-CM | POA: Diagnosis not present

## 2012-04-23 DIAGNOSIS — H81319 Aural vertigo, unspecified ear: Secondary | ICD-10-CM | POA: Diagnosis not present

## 2012-04-26 ENCOUNTER — Emergency Department (HOSPITAL_COMMUNITY)
Admission: EM | Admit: 2012-04-26 | Discharge: 2012-04-26 | Disposition: A | Discharge status: Home / Self Care | Payer: Medicare Other | Attending: Emergency Medicine | Admitting: Emergency Medicine

## 2012-04-26 ENCOUNTER — Emergency Department (HOSPITAL_COMMUNITY): Payer: Medicare Other

## 2012-04-26 ENCOUNTER — Telehealth: Payer: Self-pay

## 2012-04-26 ENCOUNTER — Encounter (HOSPITAL_COMMUNITY): Payer: Self-pay | Admitting: Emergency Medicine

## 2012-04-26 DIAGNOSIS — E785 Hyperlipidemia, unspecified: Secondary | ICD-10-CM | POA: Insufficient documentation

## 2012-04-26 DIAGNOSIS — I10 Essential (primary) hypertension: Secondary | ICD-10-CM | POA: Diagnosis not present

## 2012-04-26 DIAGNOSIS — M79609 Pain in unspecified limb: Secondary | ICD-10-CM | POA: Insufficient documentation

## 2012-04-26 DIAGNOSIS — R42 Dizziness and giddiness: Secondary | ICD-10-CM | POA: Diagnosis not present

## 2012-04-26 DIAGNOSIS — Z7982 Long term (current) use of aspirin: Secondary | ICD-10-CM | POA: Diagnosis not present

## 2012-04-26 DIAGNOSIS — R269 Unspecified abnormalities of gait and mobility: Secondary | ICD-10-CM | POA: Diagnosis not present

## 2012-04-26 DIAGNOSIS — R0789 Other chest pain: Secondary | ICD-10-CM | POA: Diagnosis not present

## 2012-04-26 DIAGNOSIS — Z79899 Other long term (current) drug therapy: Secondary | ICD-10-CM | POA: Insufficient documentation

## 2012-04-26 DIAGNOSIS — R197 Diarrhea, unspecified: Secondary | ICD-10-CM | POA: Insufficient documentation

## 2012-04-26 DIAGNOSIS — R079 Chest pain, unspecified: Secondary | ICD-10-CM | POA: Diagnosis not present

## 2012-04-26 DIAGNOSIS — R112 Nausea with vomiting, unspecified: Secondary | ICD-10-CM | POA: Insufficient documentation

## 2012-04-26 DIAGNOSIS — R51 Headache: Secondary | ICD-10-CM | POA: Insufficient documentation

## 2012-04-26 LAB — TROPONIN I
Troponin I: <0.3 ng/mL (ref <0.30)
Troponin I: <0.3 ng/mL (ref <0.30)

## 2012-04-26 LAB — CBC
HCT: 42 % (ref 39.0–52.0)
Hemoglobin: 15 g/dL (ref 13.0–17.0)
MCH: 30.9 pg (ref 26.0–34.0)
MCHC: 35.7 g/dL (ref 30.0–36.0)
MCV: 86.6 fL (ref 78.0–100.0)
Platelets: 208 10*3/uL (ref 150–400)
RBC: 4.85 MIL/uL (ref 4.22–5.81)
RDW: 12.1 % (ref 11.5–15.5)
WBC: 5.4 10*3/uL (ref 4.0–10.5)

## 2012-04-26 LAB — COMPREHENSIVE METABOLIC PANEL
ALT: 14 U/L (ref 0–53)
AST: 17 U/L (ref 0–37)
Alkaline Phosphatase: 127 U/L — ABNORMAL HIGH (ref 39–117)
CO2: 27 mEq/L (ref 19–32)
Calcium: 9.1 mg/dL (ref 8.4–10.5)
GFR calc non Af Amer: 80 mL/min — ABNORMAL LOW (ref >90)
Potassium: 3.5 mEq/L (ref 3.5–5.1)
Sodium: 140 mEq/L (ref 135–145)

## 2012-04-26 LAB — URINALYSIS, ROUTINE W REFLEX MICROSCOPIC
Glucose, UA: NEGATIVE mg/dL
Hgb urine dipstick: NEGATIVE
Ketones, ur: 40 mg/dL — AB
Leukocytes, UA: NEGATIVE
Nitrite: NEGATIVE
Protein, ur: NEGATIVE mg/dL
Specific Gravity, Urine: 1.033 — ABNORMAL HIGH (ref 1.005–1.030)
Urobilinogen, UA: 0.2 mg/dL (ref 0.0–1.0)
pH: 5.5 (ref 5.0–8.0)

## 2012-04-26 MED ORDER — HYDROMORPHONE HCL PF 1 MG/ML IJ SOLN
0.5000 mg | Freq: Once | INTRAMUSCULAR | Status: AC
  Administered 2012-04-26: 0.5 mg via INTRAVENOUS
  Filled 2012-04-26: qty 1

## 2012-04-26 MED ORDER — SODIUM CHLORIDE 0.9 % IV SOLN
INTRAVENOUS | Status: DC
  Administered 2012-04-26: 20 mL/h via INTRAVENOUS
  Administered 2012-04-26: 11:00:00 via INTRAVENOUS

## 2012-04-26 MED ORDER — NITROGLYCERIN 0.4 MG SL SUBL
0.4000 mg | SUBLINGUAL_TABLET | SUBLINGUAL | Status: DC | PRN
  Administered 2012-04-26 (×3): 0.4 mg via SUBLINGUAL
  Filled 2012-04-26: qty 25

## 2012-04-26 MED ORDER — ONDANSETRON HCL 4 MG/2ML IJ SOLN
4.0000 mg | Freq: Once | INTRAMUSCULAR | Status: AC
  Administered 2012-04-26: 4 mg via INTRAVENOUS
  Filled 2012-04-26: qty 2

## 2012-04-26 MED ORDER — ASPIRIN 81 MG PO CHEW
CHEWABLE_TABLET | ORAL | Status: AC
  Administered 2012-04-26: 324 mg
  Filled 2012-04-26: qty 4

## 2012-04-26 MED ORDER — ASPIRIN 325 MG PO TABS: 325.0000 mg | ORAL_TABLET | ORAL | Status: DC

## 2012-04-26 MED ORDER — LORAZEPAM 2 MG/ML IJ SOLN: 0.5000 mg | Freq: Once | INTRAMUSCULAR | Status: DC

## 2012-04-26 NOTE — ED Notes (Signed)
Pt states he has central chest pain with pain radiating down L arm.  Pt states that pain started on Saturday.  Went to [facility] and was diagnosed with vertigo.  Pain and weakness began to get worse last night through this am.  Pt also started having a headache this am that felt like "a towel wrapped around my head."  Pt awake and oriented.  HR 77 regular, BP136/71.  S1S2 heard, lungs clear, radial pulse strong bilaterally and pedal pulses strong bilaterally.

## 2012-04-26 NOTE — ED Provider Notes (Signed)
History     CSN: [id number]  Arrival date & time 04/26/12  4098   First MD Initiated Contact with Patient 04/26/12 1036      Chief Complaint  Patient presents with  . Chest Pain  . Headache  . Arm Pain  . Nausea    (Consider location/radiation/quality/duration/timing/severity/associated sxs/prior treatment) Patient is a 76 y.o. male presenting with chest pain, headaches, and arm pain. The history is provided by the patient.  Chest Pain Primary symptoms include vomiting. Pertinent negatives for primary symptoms include no fever, no shortness of breath, no cough and no abdominal pain.  Pertinent negatives for associated symptoms include no numbness and no weakness.    Headache  Associated symptoms include vomiting. Pertinent negatives include no fever and no shortness of breath.  Arm Pain Associated symptoms include chest pain and headaches. Pertinent negatives include no abdominal pain and no shortness of breath.  pt c/o mid chest pain, left arm pain for past couple days. Pain constant for past day. No change w activity or exertion. No associated sob,  or diaphoresis. Had had intermittent nvd in past day. No hx cad. ?father w cad in older age. No pleuritic pain. No cough or uri symptoms. No back pain. No leg pain or swelling. Also states was seen at [facility] 3 days ago w dizziness, leftarm pain. States was told possibly vertigo. Pt notes in past few days intermittent head pain as well, states feels as if band wrapped around scalp. Denies hx chronic headaches, migraines, tension headaches. Headache gradual onset, intermittent. No eye pain or change in vision. No numbness/weakness. No change in speech. No recent head injury. Pt als notes in past day, intermittent nvd. States today no emesis but had dry heaves and single loose stool. No abd pain.      Past Medical History  Diagnosis Date  . ALLERGIC RHINITIS 10/19/2007  . ALLERGY 08/13/2007  . BENIGN PROSTATIC HYPERTROPHY  10/19/2007  . CHEST PAIN, ATYPICAL 12/03/2008  . DYSPNEA 11/20/2008  . ERECTILE DYSFUNCTION 10/19/2007  . FATIGUE 10/19/2007  . FREQUENCY, URINARY 11/20/2008  . GERD 10/19/2007  . GLUCOSE INTOLERANCE 10/19/2007  . HEMORRHOIDS, HX OF 08/13/2007  . HYPERLIPIDEMIA 10/19/2007  . HYPERTENSION 08/13/2007  . NEPHROLITHIASIS, HX OF 10/19/2007  . NEPHROLITHIASIS 08/13/2007  . ORCHIECTOMY, HX OF 10/19/2007  . Spermatocele 08/13/2007  . Impaired glucose tolerance 07/08/2011    Past Surgical History  Procedure Date  . S/p right orchiectomy     benign mass, right  . Inguinal herniorrhapy\     Family History  Problem Relation Age of Onset  . Dementia Mother   . Heart disease Father   . Dementia Father     History  Substance Use Topics  . Smoking status: Former Games developer  . Smokeless tobacco: Not on file  . Alcohol Use: No      Review of Systems  Constitutional: Negative for fever.  HENT: Negative for rhinorrhea, neck pain and neck stiffness.   Eyes: Negative for pain, redness and visual disturbance.  Respiratory: Negative for cough and shortness of breath.   Cardiovascular: Positive for chest pain. Negative for leg swelling.  Gastrointestinal: Positive for vomiting and diarrhea. Negative for abdominal pain.  Genitourinary: Negative for dysuria and flank pain.  Musculoskeletal: Negative for back pain.  Skin: Negative for rash.  Neurological: Positive for headaches. Negative for syncope, weakness, light-headedness and numbness.  Hematological: Does not bruise/bleed easily.  Psychiatric/Behavioral: Negative for confusion.    Allergies  Lovastatin  Home Medications  Current Outpatient Rx  Name Route Sig Dispense Refill  . AMLODIPINE BESYLATE 5 MG PO TABS Oral Take 1 tablet (5 mg total) by mouth daily. 90 tablet 3  . ASPIRIN 81 MG PO TABS Oral Take 81 mg by mouth daily.      . ATORVASTATIN CALCIUM 10 MG PO TABS Oral Take 1 tablet (10 mg total) by mouth daily. 90 tablet 3  .  LORAZEPAM 1 MG PO TABS Oral Take 1 mg by mouth every 8 (eight) hours. For dizziness    . ONDANSETRON 4 MG PO TBDP Oral Take 4 mg by mouth every 8 (eight) hours as needed. For nausea      BP 136/71  Pulse 77  Temp(Src) 97.6 F (36.4 C) (Oral)  Resp 15  Ht 6' (1.829 m)  Wt 170 lb (77.111 kg)  BMI 23.06 kg/m2  SpO2 97%  Physical Exam  Nursing note and vitals reviewed. Constitutional: He is oriented to person, place, and time. He appears well-developed and well-nourished. No distress.  HENT:  Head: Atraumatic.  Eyes: Pupils are equal, round, and reactive to light.  Neck: Neck supple. No tracheal deviation present.  Cardiovascular: Normal rate, regular rhythm, normal heart sounds and intact distal pulses.  Exam reveals no gallop and no friction rub.   No murmur heard. Pulmonary/Chest: Effort normal and breath sounds normal. No accessory muscle usage. No respiratory distress. He exhibits tenderness.  Abdominal: Soft. Bowel sounds are normal. He exhibits no distension.  Genitourinary:       No cva tenderness  Musculoskeletal: Normal range of motion. He exhibits no edema and no tenderness.  Neurological: He is alert and oriented to person, place, and time. No cranial nerve deficit.       Motor intact bil. No pronator drift. Steady gait.   Skin: Skin is warm and dry.  Psychiatric: He has a normal mood and affect.    ED Course  Procedures (including critical care time)   Labs Reviewed  CBC  COMPREHENSIVE METABOLIC PANEL  LIPASE, BLOOD  TROPONIN I  URINALYSIS, ROUTINE W REFLEX MICROSCOPIC    Results for orders placed during the hospital encounter of 04/26/12  CBC      Component Value Range   WBC 5.4  4.0 - 10.5 (K/uL)   RBC 4.85  4.22 - 5.81 (MIL/uL)   Hemoglobin 15.0  13.0 - 17.0 (g/dL)   HCT 16.1  09.6 - 04.5 (%)   MCV 86.6  78.0 - 100.0 (fL)   MCH 30.9  26.0 - 34.0 (pg)   MCHC 35.7  30.0 - 36.0 (g/dL)   RDW 40.9  81.1 - 91.4 (%)   Platelets 208  150 - 400 (K/uL)    COMPREHENSIVE METABOLIC PANEL      Component Value Range   Sodium 140  135 - 145 (mEq/L)   Potassium 3.5  3.5 - 5.1 (mEq/L)   Chloride 103  96 - 112 (mEq/L)   CO2 27  19 - 32 (mEq/L)   Glucose, Bld 103 (*) 70 - 99 (mg/dL)   BUN 18  6 - 23 (mg/dL)   Creatinine, Ser 7.82  0.50 - 1.35 (mg/dL)   Calcium 9.1  8.4 - 95.6 (mg/dL)   Total Protein 7.1  6.0 - 8.3 (g/dL)   Albumin 4.2  3.5 - 5.2 (g/dL)   AST 17  0 - 37 (U/L)   ALT 14  0 - 53 (U/L)   Alkaline Phosphatase 127 (*) 39 - 117 (U/L)   Total Bilirubin  0.7  0.3 - 1.2 (mg/dL)   GFR calc non Af Amer 80 (*) >90 (mL/min)   GFR calc Af Amer >90  >90 (mL/min)  LIPASE, BLOOD      Component Value Range   Lipase 21  11 - 59 (U/L)  TROPONIN I      Component Value Range   Troponin I <0.30  <0.30 (ng/mL)  URINALYSIS, ROUTINE W REFLEX MICROSCOPIC      Component Value Range   Color, Urine AMBER (*) YELLOW    APPearance CLOUDY (*) CLEAR    Specific Gravity, Urine 1.033 (*) 1.005 - 1.030    pH 5.5  5.0 - 8.0    Glucose, UA NEGATIVE  NEGATIVE (mg/dL)   Hgb urine dipstick NEGATIVE  NEGATIVE    Bilirubin Urine SMALL (*) NEGATIVE    Ketones, ur 40 (*) NEGATIVE (mg/dL)   Protein, ur NEGATIVE  NEGATIVE (mg/dL)   Urobilinogen, UA 0.2  0.0 - 1.0 (mg/dL)   Nitrite NEGATIVE  NEGATIVE    Leukocytes, UA NEGATIVE  NEGATIVE   TROPONIN I      Component Value Range   Troponin I <0.30  <0.30 (ng/mL)   Dg Chest 2 View  04/26/2012  *RADIOLOGY REPORT*  Clinical Data: Dizziness, chest pain, left arm pain, weakness, nausea, headache, history high retention, smoking  CHEST - 2 VIEW  Comparison: 04/23/2012  Findings: Normal heart size, mediastinal contours, and pulmonary vascularity. Lungs clear. No pleural effusion or pneumothorax. Bones appear demineralized. Minimal peribronchial thickening, chronic.  IMPRESSION: No acute abnormalities.  Original Report Authenticated By: Lollie Marrow, M.D.      MDM  Iv ns. Labs. Ecg.  Reviewed nursing notes and prior  charts for additional history.     Date: 04/26/2012  Rate: 76  Rhythm: normal sinus rhythm  QRS Axis: normal  Intervals: normal  ST/T Wave abnormalities: normal  Conduction Disutrbances:none  Narrative Interpretation:   Old EKG Reviewed: unchanged   Recheck, 2 sets troponin after chest/arm symptoms constant for past day negative and not increasing. Pt with small area left anterior chest just lateral to sternum w pain and point tenderness, reproducible. No sts to area. No skin changes. ?costochrondritis.  On ambulating pt, slow gait, tends to shuffle, does not fall. Given gait, feeling unsteady, recent dizziness/vertigo, will get mri.   MRI still pending. Signed out to Dr Rubin Payor to check MR when back. If MRI neg/normal, feel pt will be stable for d/c home with close pcp follow up.  If positive for posterior cva or other acute process, will require admit.      Suzi Roots, MD 04/26/12 [id number]

## 2012-04-26 NOTE — ED Notes (Addendum)
Patient transported to Ultrasound. <Charted in error. Evonnie Pat, NT 04/26/12 1325>

## 2012-04-26 NOTE — Discharge Instructions (Signed)
Chest Pain (Nonspecific) Chest pain has many causes. Your pain could be caused by something serious, such as a heart attack or a blood clot in the lungs. It could also be caused by something less serious, such as a chest bruise or a virus. Follow up with your doctor. More lab tests or other studies may be needed to find the cause of your pain. Most of the time, nonspecific chest pain will improve within 2 to 3 days of rest and mild pain medicine. HOME CARE  For chest bruises, you may put ice on the sore area for 15 to 20 minutes, 3 to 4 times a day. Do this only if it makes you or your child feel better.   Put ice in a plastic bag.   Place a towel between the skin and the bag.   Rest for the next 2 to 3 days.   Go back to work if the pain improves.   See your doctor if the pain lasts longer than 1 to 2 weeks.   Only take medicine as told by your doctor.   Quit smoking if you smoke.  GET HELP RIGHT AWAY IF:   There is more pain or pain that spreads to the arm, neck, jaw, back, or belly (abdomen).   You or your child has shortness of breath.   You or your child coughs more than usual or coughs up blood.   You or your child has very bad back or belly pain, feels sick to his or her stomach (nauseous), or throws up (vomits).   You or your child has very bad weakness.   You or your child passes out (faints).   You or your child has a temperature by mouth above 102 F (38.9 C), not controlled by medicine.  Any of these problems may be serious and may be an emergency. Do not wait to see if the problems will go away. Get medical help right away. Call your local emergency services 911 in [place] Do not drive yourself to the hospital. MAKE SURE YOU:   Understand these instructions.   Will watch this condition.   Will get help right away if you or your child is not doing well or gets worse.  Document Released: 05/11/2008 Document Revised: 11/12/2011 Document Reviewed:  05/11/2008 Exit[employer] Patient Information 2012 [employer]. 

## 2012-04-26 NOTE — ED Provider Notes (Signed)
  Physical Exam  BP 129/66  Pulse 67  Temp(Src) 97.8 F (36.6 C) (Oral)  Resp 14  Ht 6' (1.829 m)  Wt 170 lb (77.111 kg)  BMI 23.06 kg/m2  SpO2 97%  Physical Exam  ED Course  Procedures  MDM Received signout from Dr. Denton Lank. MRI is reassuring. Patient with multiple complaints. Was diagnosed with vertigo at [facility]. Cardiac cause likely ruled out. Patient will follow with his primary care Dr.      Juliet Rude. Rubin Payor, MD 04/26/12 1744

## 2012-04-26 NOTE — Telephone Encounter (Signed)
Pt called c/o Lt neck and jaw tightness, Lt arm weakness and general feeling of fatigue. Pt also said that he has been experiencing some chest heaviness along with sxs. Pt advised to go to ER ASAP and he agreed to go to [facility] ER now.

## 2013-12-22 ENCOUNTER — Ambulatory Visit (INDEPENDENT_AMBULATORY_CARE_PROVIDER_SITE_OTHER): Payer: Medicare Other | Admitting: Internal Medicine

## 2013-12-22 ENCOUNTER — Other Ambulatory Visit (INDEPENDENT_AMBULATORY_CARE_PROVIDER_SITE_OTHER): Payer: Medicare Other

## 2013-12-22 ENCOUNTER — Encounter: Payer: Self-pay | Admitting: Internal Medicine

## 2013-12-22 VITALS — BP 132/82 | HR 82 | Temp 98.8°F | Ht 72.0 in | Wt 167.2 lb

## 2013-12-22 DIAGNOSIS — E785 Hyperlipidemia, unspecified: Secondary | ICD-10-CM | POA: Diagnosis not present

## 2013-12-22 DIAGNOSIS — Z23 Encounter for immunization: Secondary | ICD-10-CM | POA: Diagnosis not present

## 2013-12-22 DIAGNOSIS — R7302 Impaired glucose tolerance (oral): Secondary | ICD-10-CM

## 2013-12-22 DIAGNOSIS — I10 Essential (primary) hypertension: Secondary | ICD-10-CM

## 2013-12-22 DIAGNOSIS — R7309 Other abnormal glucose: Secondary | ICD-10-CM

## 2013-12-22 DIAGNOSIS — N320 Bladder-neck obstruction: Secondary | ICD-10-CM

## 2013-12-22 LAB — PSA: PSA: 1.73 ng/mL (ref 0.10–4.00)

## 2013-12-22 LAB — HEPATIC FUNCTION PANEL
ALBUMIN: 3.8 g/dL (ref 3.5–5.2)
ALK PHOS: 102 U/L (ref 39–117)
ALT: 24 U/L (ref 0–53)
AST: 24 U/L (ref 0–37)
Bilirubin, Direct: 0.2 mg/dL (ref 0.0–0.3)
Total Bilirubin: 0.9 mg/dL (ref 0.3–1.2)
Total Protein: 6.3 g/dL (ref 6.0–8.3)

## 2013-12-22 LAB — LIPID PANEL
Cholesterol: 130 mg/dL (ref 0–200)
HDL: 42.7 mg/dL (ref >39.00)
LDL Cholesterol: 74 mg/dL (ref 0–99)
TRIGLYCERIDES: 68 mg/dL (ref 0.0–149.0)
Total CHOL/HDL Ratio: 3
VLDL: 13.6 mg/dL (ref 0.0–40.0)

## 2013-12-22 LAB — CBC WITH DIFFERENTIAL/PLATELET
BASOS PCT: 0.4 % (ref 0.0–3.0)
Basophils Absolute: 0 10*3/uL (ref 0.0–0.1)
EOS PCT: 0.6 % (ref 0.0–5.0)
Eosinophils Absolute: 0 10*3/uL (ref 0.0–0.7)
HEMATOCRIT: 41.6 % (ref 39.0–52.0)
HEMOGLOBIN: 14.5 g/dL (ref 13.0–17.0)
LYMPHS ABS: 1 10*3/uL (ref 0.7–4.0)
LYMPHS PCT: 20.9 % (ref 12.0–46.0)
MCHC: 34.9 g/dL (ref 30.0–36.0)
MCV: 88.7 fl (ref 78.0–100.0)
MONOS PCT: 12.8 % — AB (ref 3.0–12.0)
Monocytes Absolute: 0.6 10*3/uL (ref 0.1–1.0)
NEUTROS ABS: 3.2 10*3/uL (ref 1.4–7.7)
Neutrophils Relative %: 65.3 % (ref 43.0–77.0)
Platelets: 174 10*3/uL (ref 150.0–400.0)
RBC: 4.69 Mil/uL (ref 4.22–5.81)
RDW: 13 % (ref 11.5–14.6)
WBC: 4.9 10*3/uL (ref 4.5–10.5)

## 2013-12-22 LAB — URINALYSIS, ROUTINE W REFLEX MICROSCOPIC
Bilirubin Urine: NEGATIVE
KETONES UR: NEGATIVE
Leukocytes, UA: NEGATIVE
Nitrite: NEGATIVE
PH: 5.5 (ref 5.0–8.0)
SPECIFIC GRAVITY, URINE: 1.02 (ref 1.000–1.030)
Total Protein, Urine: NEGATIVE
URINE GLUCOSE: NEGATIVE
Urobilinogen, UA: 0.2 (ref 0.0–1.0)

## 2013-12-22 LAB — BASIC METABOLIC PANEL
BUN: 17 mg/dL (ref 6–23)
CALCIUM: 9.3 mg/dL (ref 8.4–10.5)
CO2: 32 mEq/L (ref 19–32)
CREATININE: 1.1 mg/dL (ref 0.4–1.5)
Chloride: 101 mEq/L (ref 96–112)
GFR: 72.23 mL/min (ref >60.00)
GLUCOSE: 89 mg/dL (ref 70–99)
Potassium: 4.2 mEq/L (ref 3.5–5.1)
SODIUM: 138 meq/L (ref 135–145)

## 2013-12-22 LAB — TSH: TSH: 0.79 u[IU]/mL (ref 0.35–5.50)

## 2013-12-22 LAB — HEMOGLOBIN A1C: HEMOGLOBIN A1C: 5.1 % (ref 4.6–6.5)

## 2013-12-22 MED ORDER — AMLODIPINE BESYLATE 5 MG PO TABS: 5.0000 mg | ORAL_TABLET | Freq: Every day | ORAL | Status: DC

## 2013-12-22 MED ORDER — ATORVASTATIN CALCIUM 10 MG PO TABS: 10.0000 mg | ORAL_TABLET | Freq: Every day | ORAL | Status: DC

## 2013-12-22 NOTE — Progress Notes (Signed)
Pre-visit discussion using our clinic review tool. No additional management support is needed unless otherwise documented below in the visit note.  

## 2013-12-22 NOTE — Assessment & Plan Note (Signed)
stable overall by history and exam, recent data reviewed with pt, and pt to continue medical treatment as before,  to f/u any worsening symptoms or concerns BP Readings from Last 3 Encounters:  12/22/13 132/82  04/26/12 122/58  07/08/11 162/88

## 2013-12-22 NOTE — Patient Instructions (Addendum)
You had the flu shot today Please make a Nurse Visit appt for 2 wks to have the Prevnar pneumonia shot You are given the prescription for the shingles shot to have done such as at [facility]  Please continue your efforts at being more active, low cholesterol diet, and weight control. You are otherwise up to date with prevention measures today.  Please go to the LAB in the Basement (turn left off the elevator) for the tests to be done today You will be contacted by phone if any changes need to be made immediately.  Otherwise, you will receive a letter about your results with an explanation, but please check with MyChart first.  Please remember to sign up for My Chart if you have not done so, as this will be important to you in the future with finding out test results, communicating by private email, and scheduling acute appointments online when needed.  Please return in 1 year for your yearly visit, or sooner if needed

## 2013-12-22 NOTE — Assessment & Plan Note (Signed)
Asympt, for a1c today,  to f/u any worsening symptoms or concerns 

## 2013-12-22 NOTE — Progress Notes (Signed)
Subjective:    Patient ID: Connor Morgan, male    DOB: [birth year], 78 y.o.   MRN: [id number]  HPI Here for yearly f/u;  Overall doing ok;  Pt denies CP, worsening SOB, DOE, wheezing, orthopnea, PND, worsening LE edema, palpitations, dizziness or syncope.  Pt denies neurological change such as new headache, facial or extremity weakness.  Pt denies polydipsia, polyuria, or low sugar symptoms. Pt states overall good compliance with treatment and medications, good tolerability, and has been trying to follow lower cholesterol diet.  Pt denies worsening depressive symptoms, suicidal ideation or panic. No fever, night sweats, wt loss, loss of appetite, or other constitutional symptoms.  Pt states good ability with ADL's, has low fall risk, home safety reviewed and adequate, no other significant changes in hearing or vision, and only occasionally active with exercise. Wife disabled with malignancy.  Fro flu shot, labs and refills today, asks for rx for shingles shot, and no acute complaints Past Medical History  Diagnosis Date  . ALLERGIC RHINITIS 10/19/2007  . ALLERGY 08/13/2007  . BENIGN PROSTATIC HYPERTROPHY 10/19/2007  . CHEST PAIN, ATYPICAL 12/03/2008  . DYSPNEA 11/20/2008  . ERECTILE DYSFUNCTION 10/19/2007  . FATIGUE 10/19/2007  . FREQUENCY, URINARY 11/20/2008  . GERD 10/19/2007  . GLUCOSE INTOLERANCE 10/19/2007  . HEMORRHOIDS, HX OF 08/13/2007  . HYPERLIPIDEMIA 10/19/2007  . HYPERTENSION 08/13/2007  . NEPHROLITHIASIS, HX OF 10/19/2007  . NEPHROLITHIASIS 08/13/2007  . ORCHIECTOMY, HX OF 10/19/2007  . Spermatocele 08/13/2007  . Impaired glucose tolerance 07/08/2011   Past Surgical History  Procedure Laterality Date  . S/p right orchiectomy      benign mass, right  . Inguinal herniorrhapy\      reports that he has quit smoking. He does not have any smokeless tobacco history on file. He reports that he does not drink alcohol. His drug history is not on file. family history includes Dementia in his  father and mother; Heart disease in his father. Allergies  Allergen Reactions  . Lovastatin     REACTION: myalgias   Current Outpatient Prescriptions on File Prior to Visit  Medication Sig Dispense Refill  . aspirin 81 MG tablet Take 81 mg by mouth daily.         No current facility-administered medications on file prior to visit.   Review of Systems  Constitutional: Negative for unexpected weight change, or unusual diaphoresis  HENT: Negative for tinnitus.   Eyes: Negative for photophobia and visual disturbance.  Respiratory: Negative for choking and stridor.   Gastrointestinal: Negative for vomiting and blood in stool.  Genitourinary: Negative for hematuria and decreased urine volume.  Musculoskeletal: Negative for acute joint swelling Skin: Negative for color change and wound.  Neurological: Negative for tremors and numbness other than noted  Psychiatric/Behavioral: Negative for decreased concentration or  hyperactivity.       Objective:   Physical Exam BP 132/82  Pulse 82  Temp(Src) 98.8 F (37.1 C) (Oral)  Ht 6' (1.829 m)  Wt 167 lb 4 oz (75.864 kg)  BMI 22.68 kg/m2  SpO2 96% VS noted, not ill appearing Constitutional: Pt appears well-developed and well-nourished.  HENT: Head: NCAT.  Right Ear: External ear normal.  Left Ear: External ear normal.  Eyes: Conjunctivae and EOM are normal. Pupils are equal, round, and reactive to light.  Neck: Normal range of motion. Neck supple.  Cardiovascular: Normal rate and regular rhythm.   Pulmonary/Chest: Effort normal and breath sounds normal.  Abd:  Soft, NT, non-distended, + BS Neurological:  Pt is alert. Not confused ., motor 5/5 Skin: Skin is warm. No erythema. No LE edema Psychiatric: Pt behavior is normal. Thought content normal.     Assessment & Plan:

## 2013-12-22 NOTE — Assessment & Plan Note (Signed)
Also for psa as he is due 

## 2013-12-22 NOTE — Assessment & Plan Note (Signed)
stable overall by history and exam, recent data reviewed with pt, and pt to continue medical treatment as before,  to f/u any worsening symptoms or concerns Lab Results  Component Value Date   WBC 4.9 12/22/2013   HGB 14.5 12/22/2013   HCT 41.6 12/22/2013   PLT 174.0 12/22/2013   GLUCOSE 89 12/22/2013   CHOL 130 12/22/2013   TRIG 68.0 12/22/2013   HDL 42.70 12/22/2013   LDLDIRECT 128.8 10/19/2007   LDLCALC 74 12/22/2013   ALT 24 12/22/2013   AST 24 12/22/2013   NA 138 12/22/2013   K 4.2 12/22/2013   CL 101 12/22/2013   CREATININE 1.1 12/22/2013   BUN 17 12/22/2013   CO2 32 12/22/2013   TSH 0.79 12/22/2013   PSA 1.73 12/22/2013   HGBA1C 5.1 12/22/2013   For fu labs

## 2014-01-05 ENCOUNTER — Ambulatory Visit (INDEPENDENT_AMBULATORY_CARE_PROVIDER_SITE_OTHER): Payer: Medicare Other | Admitting: Internal Medicine

## 2014-01-05 ENCOUNTER — Encounter: Payer: Self-pay | Admitting: Internal Medicine

## 2014-01-05 VITALS — BP 132/80 | HR 74 | Temp 99.2°F | Wt 168.1 lb

## 2014-01-05 DIAGNOSIS — I10 Essential (primary) hypertension: Secondary | ICD-10-CM

## 2014-01-05 DIAGNOSIS — R7309 Other abnormal glucose: Secondary | ICD-10-CM

## 2014-01-05 DIAGNOSIS — Z23 Encounter for immunization: Secondary | ICD-10-CM | POA: Diagnosis not present

## 2014-01-05 DIAGNOSIS — R350 Frequency of micturition: Secondary | ICD-10-CM

## 2014-01-05 DIAGNOSIS — R21 Rash and other nonspecific skin eruption: Secondary | ICD-10-CM | POA: Diagnosis not present

## 2014-01-05 DIAGNOSIS — R7302 Impaired glucose tolerance (oral): Secondary | ICD-10-CM

## 2014-01-05 MED ORDER — TAMSULOSIN HCL 0.4 MG PO CAPS: 0.4000 mg | ORAL_CAPSULE | Freq: Every day | ORAL | Status: DC

## 2014-01-05 MED ORDER — TRIAMCINOLONE ACETONIDE 0.1 % EX CREA: 1.0000 "application " | TOPICAL_CREAM | Freq: Two times a day (BID) | CUTANEOUS | Status: DC

## 2014-01-05 NOTE — Assessment & Plan Note (Signed)
stable overall by history and exam, recent data reviewed with pt, and pt to continue medical treatment as before,  to f/u any worsening symptoms or concerns BP Readings from Last 3 Encounters:  01/05/14 132/80  12/22/13 132/82  04/26/12 122/58

## 2014-01-05 NOTE — Assessment & Plan Note (Signed)
stable overall by history and exam, recent data reviewed with pt, and pt to continue medical treatment as before,  to f/u any worsening symptoms or concerns Lab Results  Component Value Date   HGBA1C 5.1 12/22/2013    unlikely to be related to uranary freq

## 2014-01-05 NOTE — Assessment & Plan Note (Signed)
Right mid/lower ant leg - c/w prob contact dermatitis - for triam cr prn,  to f/u any worsening symptoms or concerns

## 2014-01-05 NOTE — Patient Instructions (Addendum)
You had the new Prevnar pneumoia shot today Please take all new medication as prescribed - the cream for the leg, and the new prostate medication - generic for Flomax 0.4 mg per day  Please call urology or return here if not improved in 2 wks  Your lab results will be faxed to Dr Peterson/urology

## 2014-01-05 NOTE — Assessment & Plan Note (Addendum)
Now at q 2 hrs, recent UA neg, has seen Dr Peterson/urology in past, recent PSA slightly increased 0.5 over last yr, still relatively low at 1.73; no dysuria or other - for flomax trial qd, f/u 2 wks if not improved or see urology, consider add OAB med

## 2014-01-05 NOTE — Addendum Note (Signed)
Addended by: Sharon Seller B on: 01/05/2014 10:27 AM   Modules accepted: Orders

## 2014-01-05 NOTE — Progress Notes (Signed)
Pre-visit discussion using our clinic review tool. No additional management support is needed unless otherwise documented below in the visit note.  

## 2014-01-05 NOTE — Progress Notes (Signed)
Subjective:    Patient ID: Connor Morgan, male    DOB: 12-Dec-1935, 78 y.o.   MRN: [id number]  HPI  Here for f/u, originally sched for nurse visit for Prevnar only, but also with new onset 3 days pruritic rash mid and lower right ant leg, after wearing short pants, and incidentally hit shin in same area on a car bumper the same day.  No fever, red streaks, swelling or drainage.  Hemorrhoid cream helped the itching.  Not getting worse, but no better either, scratches quite a bit at night.  Also with q 2 hr freq urination as well, recent ua and psa as documented, no fever, pain, and recent glc normal.  He is wondering about an OAB med he saw on ad on TV.  Pt denies chest pain, increased sob or doe, wheezing, orthopnea, PND, increased LE swelling, palpitations, dizziness or syncope.  Pt denies new neurological symptoms such as new headache, or facial or extremity weakness or numbness   Pt denies polydipsia, polyuria, Sees Dr Peterson/urology for urological, declines DRE today.  Pt denies fever, wt loss, night sweats, loss of appetite, or other constitutional symptoms Past Medical History  Diagnosis Date  . ALLERGIC RHINITIS 10/19/2007  . ALLERGY 08/13/2007  . BENIGN PROSTATIC HYPERTROPHY 10/19/2007  . CHEST PAIN, ATYPICAL 12/03/2008  . DYSPNEA 11/20/2008  . ERECTILE DYSFUNCTION 10/19/2007  . FATIGUE 10/19/2007  . FREQUENCY, URINARY 11/20/2008  . GERD 10/19/2007  . GLUCOSE INTOLERANCE 10/19/2007  . HEMORRHOIDS, HX OF 08/13/2007  . HYPERLIPIDEMIA 10/19/2007  . HYPERTENSION 08/13/2007  . NEPHROLITHIASIS, HX OF 10/19/2007  . NEPHROLITHIASIS 08/13/2007  . ORCHIECTOMY, HX OF 10/19/2007  . Spermatocele 08/13/2007  . Impaired glucose tolerance 07/08/2011   Past Surgical History  Procedure Laterality Date  . S/p right orchiectomy      benign mass, right  . Inguinal herniorrhapy\      reports that he has quit smoking. He does not have any smokeless tobacco history on file. He reports that he does not drink  alcohol. His drug history is not on file. family history includes Dementia in his father and mother; Heart disease in his father. Allergies  Allergen Reactions  . Lovastatin     REACTION: myalgias   Current Outpatient Prescriptions on File Prior to Visit  Medication Sig Dispense Refill  . amLODipine (NORVASC) 5 MG tablet Take 1 tablet (5 mg total) by mouth daily.  90 tablet  3  . aspirin 81 MG tablet Take 81 mg by mouth daily.        Marland Kitchen atorvastatin (LIPITOR) 10 MG tablet Take 1 tablet (10 mg total) by mouth daily.  90 tablet  3   No current facility-administered medications on file prior to visit.   Review of Systems  Constitutional: Negative for unexpected weight change, or unusual diaphoresis  HENT: Negative for tinnitus.   Eyes: Negative for photophobia and visual disturbance.  Respiratory: Negative for choking and stridor.   Gastrointestinal: Negative for vomiting and blood in stool.  Genitourinary: Negative for hematuria and decreased urine volume.  Musculoskeletal: Negative for acute joint swelling Skin: Negative for color change and wound.  Neurological: Negative for tremors and numbness other than noted  Psychiatric/Behavioral: Negative for decreased concentration or  hyperactivity.       Objective:   Physical Exam BP 132/80  Pulse 74  Temp(Src) 99.2 F (37.3 C) (Oral)  Wt 168 lb 2 oz (76.261 kg)  SpO2 98% VS noted, not ill appearing Constitutional: Pt appears well-developed  and well-nourished.  HENT: Head: NCAT.  Right Ear: External ear normal.  Left Ear: External ear normal.  Eyes: Conjunctivae and EOM are normal. Pupils are equal, round, and reactive to light.  Neck: Normal range of motion. Neck supple.  Cardiovascular: Normal rate and regular rhythm.   Pulmonary/Chest: Effort normal and breath sounds normal.  Abd: soft, NT, +BS Neurological: Pt is alert. Not confused  Skin: Skin is warm. Has nonpetechial but tntc erythem rash in a 4x 6 cm area right mid and  lower leg anteriorly, no wound , swelling, drainage or red streaks Psychiatric: Pt behavior is normal. Thought content normal.     Assessment & Plan:

## 2014-03-14 ENCOUNTER — Encounter: Payer: Self-pay | Admitting: Internal Medicine

## 2014-03-14 ENCOUNTER — Ambulatory Visit (INDEPENDENT_AMBULATORY_CARE_PROVIDER_SITE_OTHER): Payer: Medicare Other | Admitting: Internal Medicine

## 2014-03-14 VITALS — BP 140/90 | HR 72 | Temp 98.2°F | Ht 72.0 in | Wt 176.5 lb

## 2014-03-14 DIAGNOSIS — Z136 Encounter for screening for cardiovascular disorders: Secondary | ICD-10-CM

## 2014-03-14 DIAGNOSIS — R350 Frequency of micturition: Secondary | ICD-10-CM

## 2014-03-14 DIAGNOSIS — Z0000 Encounter for general adult medical examination without abnormal findings: Secondary | ICD-10-CM | POA: Diagnosis not present

## 2014-03-14 NOTE — Assessment & Plan Note (Addendum)

## 2014-03-14 NOTE — Progress Notes (Signed)
Subjective:    Patient ID: Connor Morgan, male    DOB: November 02, 1936, 78 y.o.   MRN: [id number]  HPI  Here for wellness and f/u;  Overall doing ok;  Pt denies CP, worsening SOB, DOE, wheezing, orthopnea, PND, worsening LE edema, palpitations, dizziness or syncope.  Pt denies neurological change such as new headache, facial or extremity weakness.  Pt denies polydipsia, polyuria, or low sugar symptoms. Pt states overall good compliance with treatment and medications, good tolerability, and has been trying to follow lower cholesterol diet.  Pt denies worsening depressive symptoms, suicidal ideation or panic. No fever, night sweats, wt loss, loss of appetite, or other constitutional symptoms.  Pt states good ability with ADL's, has low fall risk, home safety reviewed and adequate, no other significant changes in hearing or vision, and only occasionally active with exercise.  Wife to have hip and knee surgury soon, so he is not willing for colonosocpy at this time.  Did reduce his flomax to every 3rd day due to nocturnal palpitations one night, but has not recurred so plans to start daily again, stil urinating about every 2 hrs Past Medical History  Diagnosis Date  . ALLERGIC RHINITIS 10/19/2007  . ALLERGY 08/13/2007  . BENIGN PROSTATIC HYPERTROPHY 10/19/2007  . CHEST PAIN, ATYPICAL 12/03/2008  . DYSPNEA 11/20/2008  . ERECTILE DYSFUNCTION 10/19/2007  . FATIGUE 10/19/2007  . FREQUENCY, URINARY 11/20/2008  . GERD 10/19/2007  . GLUCOSE INTOLERANCE 10/19/2007  . HEMORRHOIDS, HX OF 08/13/2007  . HYPERLIPIDEMIA 10/19/2007  . HYPERTENSION 08/13/2007  . NEPHROLITHIASIS, HX OF 10/19/2007  . NEPHROLITHIASIS 08/13/2007  . ORCHIECTOMY, HX OF 10/19/2007  . Spermatocele 08/13/2007  . Impaired glucose tolerance 07/08/2011   Past Surgical History  Procedure Laterality Date  . S/p right orchiectomy      benign mass, right  . Inguinal herniorrhapy\      reports that he has quit smoking. He does not have any smokeless  tobacco history on file. He reports that he does not drink alcohol. His drug history is not on file. family history includes Dementia in his father and mother; Heart disease in his father. Allergies  Allergen Reactions  . Lovastatin     REACTION: myalgias   Current Outpatient Prescriptions on File Prior to Visit  Medication Sig Dispense Refill  . amLODipine (NORVASC) 5 MG tablet Take 1 tablet (5 mg total) by mouth daily.  90 tablet  3  . aspirin 81 MG tablet Take 81 mg by mouth daily.        Marland Kitchen atorvastatin (LIPITOR) 10 MG tablet Take 1 tablet (10 mg total) by mouth daily.  90 tablet  3  . tamsulosin (FLOMAX) 0.4 MG CAPS capsule Take 1 capsule (0.4 mg total) by mouth daily.  30 capsule  11  . triamcinolone cream (KENALOG) 0.1 % Apply 1 application topically 2 (two) times daily.  30 g  0   No current facility-administered medications on file prior to visit.    Review of Systems Constitutional: Negative for diaphoresis, activity change, appetite change or unexpected weight change.  HENT: Negative for hearing loss, ear pain, facial swelling, mouth sores and neck stiffness.   Eyes: Negative for pain, redness and visual disturbance.  Respiratory: Negative for shortness of breath and wheezing.   Cardiovascular: Negative for chest pain and palpitations.  Gastrointestinal: Negative for diarrhea, blood in stool, abdominal distention or other pain Genitourinary: Negative for hematuria, flank pain or change in urine volume.  Musculoskeletal: Negative for myalgias and joint  swelling.  Skin: Negative for color change and wound.  Neurological: Negative for syncope and numbness. other than noted Hematological: Negative for adenopathy.  Psychiatric/Behavioral: Negative for hallucinations, self-injury, decreased concentration and agitation.      Objective:   Physical Exam BP 140/90  Pulse 72  Temp(Src) 98.2 F (36.8 C) (Oral)  Ht 6' (1.829 m)  Wt 176 lb 8 oz (80.06 kg)  BMI 23.93 kg/m2  SpO2  97% VS noted,  Constitutional: Pt is oriented to person, place, and time. Appears well-developed and well-nourished.  Head: Normocephalic and atraumatic.  Right Ear: External ear normal.  Left Ear: External ear normal.  Nose: Nose normal.  Mouth/Throat: Oropharynx is clear and moist.  Eyes: Conjunctivae and EOM are normal. Pupils are equal, round, and reactive to light.  Neck: Normal range of motion. Neck supple. No JVD present. No tracheal deviation present.  Cardiovascular: Normal rate, regular rhythm, normal heart sounds and intact distal pulses.   Pulmonary/Chest: Effort normal and breath sounds normal.  Abdominal: Soft. Bowel sounds are normal. There is no tenderness. No HSM  Musculoskeletal: Normal range of motion. Exhibits no edema.  Lymphadenopathy:  Has no cervical adenopathy.  Neurological: Pt is alert and oriented to person, place, and time. Pt has normal reflexes. No cranial nerve deficit.  Skin: Skin is warm and dry. No rash noted.  Psychiatric:  Has  normal mood and affect. Behavior is normal.     Assessment & Plan:

## 2014-03-14 NOTE — Patient Instructions (Addendum)
OK to try taking the flomax daily You will be contacted regarding the referral for: urology  Please continue all other medications as before, and refills have been done if requested. Please have the pharmacy call with any other refills you may need.  Please continue your efforts at being more active, low cholesterol diet, and weight control. You are otherwise up to date with prevention measures today.  Please keep your appointments with your specialists as you may have planned  Please remember to sign up for MyChart if you have not done so, as this will be important to you in the future with finding out test results, communicating by private email, and scheduling acute appointments online when needed.  Please return in 6 months, or sooner if needed

## 2014-03-14 NOTE — Progress Notes (Signed)
Pre visit review using our clinic review tool, if applicable. No additional management support is needed unless otherwise documented below in the visit note. 

## 2014-03-14 NOTE — Assessment & Plan Note (Signed)
To incr the flomax to daily, has seen Dr Peterson/urology for renal stone in the past, he is ok with referral back

## 2014-04-25 DIAGNOSIS — N529 Male erectile dysfunction, unspecified: Secondary | ICD-10-CM | POA: Diagnosis not present

## 2014-04-25 DIAGNOSIS — R39198 Other difficulties with micturition: Secondary | ICD-10-CM | POA: Diagnosis not present

## 2014-04-25 DIAGNOSIS — R350 Frequency of micturition: Secondary | ICD-10-CM | POA: Diagnosis not present

## 2014-09-18 ENCOUNTER — Ambulatory Visit: Payer: Medicare Other | Admitting: Internal Medicine

## 2015-04-18 DIAGNOSIS — L821 Other seborrheic keratosis: Secondary | ICD-10-CM | POA: Diagnosis not present

## 2015-12-05 ENCOUNTER — Encounter: Payer: Self-pay | Admitting: *Deleted

## 2015-12-26 DIAGNOSIS — R0602 Shortness of breath: Secondary | ICD-10-CM | POA: Diagnosis not present

## 2015-12-26 DIAGNOSIS — R51 Headache: Secondary | ICD-10-CM | POA: Diagnosis not present

## 2015-12-26 DIAGNOSIS — M542 Cervicalgia: Secondary | ICD-10-CM | POA: Diagnosis not present

## 2015-12-26 DIAGNOSIS — R05 Cough: Secondary | ICD-10-CM | POA: Diagnosis not present

## 2015-12-26 DIAGNOSIS — Z9114 Patient's other noncompliance with medication regimen: Secondary | ICD-10-CM | POA: Diagnosis not present

## 2015-12-26 DIAGNOSIS — I10 Essential (primary) hypertension: Secondary | ICD-10-CM | POA: Diagnosis not present

## 2015-12-26 DIAGNOSIS — R079 Chest pain, unspecified: Secondary | ICD-10-CM | POA: Diagnosis not present

## 2016-01-03 ENCOUNTER — Encounter: Payer: Self-pay | Admitting: Internal Medicine

## 2016-01-03 ENCOUNTER — Other Ambulatory Visit (INDEPENDENT_AMBULATORY_CARE_PROVIDER_SITE_OTHER): Payer: Medicare Other

## 2016-01-03 ENCOUNTER — Ambulatory Visit (INDEPENDENT_AMBULATORY_CARE_PROVIDER_SITE_OTHER): Payer: Medicare Other | Admitting: Internal Medicine

## 2016-01-03 VITALS — BP 142/86 | HR 74 | Temp 98.7°F | Resp 20 | Ht 72.0 in | Wt 176.0 lb

## 2016-01-03 DIAGNOSIS — R938 Abnormal findings on diagnostic imaging of other specified body structures: Secondary | ICD-10-CM | POA: Diagnosis not present

## 2016-01-03 DIAGNOSIS — N320 Bladder-neck obstruction: Secondary | ICD-10-CM

## 2016-01-03 DIAGNOSIS — E785 Hyperlipidemia, unspecified: Secondary | ICD-10-CM

## 2016-01-03 DIAGNOSIS — I10 Essential (primary) hypertension: Secondary | ICD-10-CM

## 2016-01-03 DIAGNOSIS — R7302 Impaired glucose tolerance (oral): Secondary | ICD-10-CM

## 2016-01-03 DIAGNOSIS — R9389 Abnormal findings on diagnostic imaging of other specified body structures: Secondary | ICD-10-CM | POA: Insufficient documentation

## 2016-01-03 LAB — PSA: PSA: 2.21 ng/mL (ref 0.10–4.00)

## 2016-01-03 LAB — LIPID PANEL
Cholesterol: 144 mg/dL (ref 0–200)
HDL: 54.9 mg/dL (ref >39.00)
LDL CALC: 71 mg/dL (ref 0–99)
NONHDL: 89.49
Total CHOL/HDL Ratio: 3
Triglycerides: 94 mg/dL (ref 0.0–149.0)
VLDL: 18.8 mg/dL (ref 0.0–40.0)

## 2016-01-03 LAB — URINALYSIS, ROUTINE W REFLEX MICROSCOPIC
BILIRUBIN URINE: NEGATIVE
KETONES UR: NEGATIVE
LEUKOCYTES UA: NEGATIVE
NITRITE: NEGATIVE
Specific Gravity, Urine: 1.01 (ref 1.000–1.030)
Total Protein, Urine: NEGATIVE
UROBILINOGEN UA: 0.2 (ref 0.0–1.0)
Urine Glucose: NEGATIVE
pH: 7 (ref 5.0–8.0)

## 2016-01-03 LAB — HEPATIC FUNCTION PANEL
ALT: 16 U/L (ref 0–53)
AST: 18 U/L (ref 0–37)
Albumin: 4.6 g/dL (ref 3.5–5.2)
Alkaline Phosphatase: 113 U/L (ref 39–117)
BILIRUBIN DIRECT: 0.2 mg/dL (ref 0.0–0.3)
BILIRUBIN TOTAL: 0.9 mg/dL (ref 0.2–1.2)
TOTAL PROTEIN: 6.8 g/dL (ref 6.0–8.3)

## 2016-01-03 LAB — CBC WITH DIFFERENTIAL/PLATELET
BASOS ABS: 0.1 10*3/uL (ref 0.0–0.1)
Basophils Relative: 1 % (ref 0.0–3.0)
EOS ABS: 0.2 10*3/uL (ref 0.0–0.7)
Eosinophils Relative: 2.6 % (ref 0.0–5.0)
HEMATOCRIT: 47.4 % (ref 39.0–52.0)
HEMOGLOBIN: 16.1 g/dL (ref 13.0–17.0)
LYMPHS PCT: 23.3 % (ref 12.0–46.0)
Lymphs Abs: 1.8 10*3/uL (ref 0.7–4.0)
MCHC: 33.9 g/dL (ref 30.0–36.0)
MCV: 91.2 fl (ref 78.0–100.0)
MONOS PCT: 7.5 % (ref 3.0–12.0)
Monocytes Absolute: 0.6 10*3/uL (ref 0.1–1.0)
Neutro Abs: 5.1 10*3/uL (ref 1.4–7.7)
Neutrophils Relative %: 65.6 % (ref 43.0–77.0)
Platelets: 216 10*3/uL (ref 150.0–400.0)
RBC: 5.2 Mil/uL (ref 4.22–5.81)
RDW: 12.9 % (ref 11.5–15.5)
WBC: 7.7 10*3/uL (ref 4.0–10.5)

## 2016-01-03 LAB — BASIC METABOLIC PANEL
BUN: 15 mg/dL (ref 6–23)
CALCIUM: 9.6 mg/dL (ref 8.4–10.5)
CHLORIDE: 103 meq/L (ref 96–112)
CO2: 33 meq/L — AB (ref 19–32)
CREATININE: 0.78 mg/dL (ref 0.40–1.50)
GFR: 102.35 mL/min (ref >60.00)
Glucose, Bld: 92 mg/dL (ref 70–99)
Potassium: 4.2 mEq/L (ref 3.5–5.1)
Sodium: 142 mEq/L (ref 135–145)

## 2016-01-03 LAB — HEMOGLOBIN A1C: HEMOGLOBIN A1C: 4.9 % (ref 4.6–6.5)

## 2016-01-03 LAB — TSH: TSH: 1.74 u[IU]/mL (ref 0.35–4.50)

## 2016-01-03 MED ORDER — ONDANSETRON HCL 8 MG PO TABS: 8.0000 mg | ORAL_TABLET | Freq: Three times a day (TID) | ORAL | Status: DC | PRN

## 2016-01-03 MED ORDER — AMLODIPINE BESYLATE 5 MG PO TABS: 5.0000 mg | ORAL_TABLET | Freq: Every day | ORAL | Status: DC

## 2016-01-03 MED ORDER — ATORVASTATIN CALCIUM 10 MG PO TABS: 10.0000 mg | ORAL_TABLET | Freq: Every day | ORAL | Status: DC

## 2016-01-03 NOTE — Progress Notes (Signed)
Subjective:    Patient ID: Connor Morgan, male    DOB: 03/19/1936, 80 y.o.   MRN: [id number]  HPI  Here for yearly f/u;  Overall doing ok;  Pt denies Chest pain, worsening SOB, DOE, wheezing, orthopnea, PND, worsening LE edema, palpitations, dizziness or syncope.  Pt denies neurological change such as new headache, facial or extremity weakness.  Pt denies polydipsia, polyuria, or low sugar symptoms. Pt states overall good compliance with treatment and medications, good tolerability, and has been trying to follow appropriate diet.  Pt denies worsening depressive symptoms, suicidal ideation or panic. No fever, night sweats, wt loss, loss of appetite, or other constitutional symptoms.  Pt states good ability with ADL's, has low fall risk, home safety reviewed and adequate, no other significant changes in hearing or vision, and only occasionally active with exercise.  More stress recently with 2 hip, 2 knees , and gastric surgury. Wife stilles $21k for this, Seen at ED last Thursday at [facility] ER with nausea and elev BP after not taking his meds "like a dummy.", cant afford medications well.  Was advised to have CT chest but he is not sure why and refuses another cxr or ct. Also Still with urinary freq, but due to wifes illness he is just not willing to see urology as well. Past Medical History  Diagnosis Date  . ALLERGIC RHINITIS 10/19/2007  . ALLERGY 08/13/2007  . BENIGN PROSTATIC HYPERTROPHY 10/19/2007  . CHEST PAIN, ATYPICAL 12/03/2008  . DYSPNEA 11/20/2008  . ERECTILE DYSFUNCTION 10/19/2007  . FATIGUE 10/19/2007  . FREQUENCY, URINARY 11/20/2008  . GERD 10/19/2007  . GLUCOSE INTOLERANCE 10/19/2007  . HEMORRHOIDS, HX OF 08/13/2007  . HYPERLIPIDEMIA 10/19/2007  . HYPERTENSION 08/13/2007  . NEPHROLITHIASIS, HX OF 10/19/2007  . NEPHROLITHIASIS 08/13/2007  . ORCHIECTOMY, HX OF 10/19/2007  . Spermatocele 08/13/2007  . Impaired glucose tolerance 07/08/2011   Past Surgical History  Procedure Laterality  Date  . S/p right orchiectomy      benign mass, right  . Inguinal herniorrhapy\      reports that he has quit smoking. He does not have any smokeless tobacco history on file. He reports that he does not drink alcohol. His drug history is not on file. family history includes Dementia in his father and mother; Heart disease in his father. Allergies  Allergen Reactions  . Lovastatin     REACTION: myalgias   Current Outpatient Prescriptions on File Prior to Visit  Medication Sig Dispense Refill  . aspirin 81 MG tablet Take 81 mg by mouth daily.      Marland Kitchen triamcinolone cream (KENALOG) 0.1 % Apply 1 application topically 2 (two) times daily. 30 g 0   No current facility-administered medications on file prior to visit.   Review of Systems  Constitutional: Negative for unusual diaphoresis or night sweats HENT: Negative for ringing in ear or discharge Eyes: Negative for double vision or worsening visual disturbance.  Respiratory: Negative for choking and stridor.   Gastrointestinal: Negative for vomiting or other signifcant bowel change Genitourinary: Negative for hematuria or change in urine volume.  Musculoskeletal: Negative for other MSK pain or swelling Skin: Negative for color change and worsening wound.  Neurological: Negative for tremors and numbness other than noted  Psychiatric/Behavioral: Negative for decreased concentration or agitation other than above       Objective:   Physical Exam BP 142/86 mmHg  Pulse 74  Temp(Src) 98.7 F (37.1 C) (Oral)  Resp 20  Ht 6' (1.829 m)  Wt 176 lb (79.833 kg)  BMI 23.86 kg/m2  SpO2 97% VS noted,  Constitutional: Pt appears in no significant distress HENT: Head: NCAT.  Right Ear: External ear normal.  Left Ear: External ear normal.  Eyes: . Pupils are equal, round, and reactive to light. Conjunctivae and EOM are normal Neck: Normal range of motion. Neck supple.  Cardiovascular: Normal rate and regular rhythm.   Pulmonary/Chest: Effort  normal and breath sounds without rales or wheezing.  Abd:  Soft, NT, ND, + BS Neurological: Pt is alert. Not confused , motor grossly intact Skin: Skin is warm. No rash, no LE edema Psychiatric: Pt behavior is normal. No agitation.     Assessment & Plan:

## 2016-01-03 NOTE — Progress Notes (Signed)
Pre visit review using our clinic review tool, if applicable. No additional management support is needed unless otherwise documented below in the visit note. 

## 2016-01-03 NOTE — Patient Instructions (Signed)

## 2016-01-04 NOTE — Assessment & Plan Note (Signed)
By report per pt, but adamant declines ct chest or even repeat cxr at this time

## 2016-01-04 NOTE — Assessment & Plan Note (Signed)
Also for psa as he is due,  to f/u any worsening symptoms or concerns, asympt 

## 2016-01-04 NOTE — Assessment & Plan Note (Signed)
stable overall by history and exam, recent data reviewed with pt, and pt to continue medical treatment as before,  to f/u any worsening symptoms or concerns Lab Results  Component Value Date   HGBA1C 4.9 01/03/2016

## 2016-01-04 NOTE — Assessment & Plan Note (Signed)
stable overall by history and exam, recent data reviewed with pt, and pt to continue medical treatment as before,  to f/u any worsening symptoms or concerns BP Readings from Last 3 Encounters:  01/03/16 142/86  03/14/14 140/90  01/05/14 132/80

## 2016-01-04 NOTE — Assessment & Plan Note (Signed)
stable overall by history and exam, recent data reviewed with pt, and pt to continue medical treatment as before,  to f/u any worsening symptoms or concerns' Lab Results  Component Value Date   LDLCALC 71 01/03/2016  '

## 2016-02-25 ENCOUNTER — Other Ambulatory Visit (INDEPENDENT_AMBULATORY_CARE_PROVIDER_SITE_OTHER): Payer: Medicare Other

## 2016-02-25 ENCOUNTER — Ambulatory Visit (INDEPENDENT_AMBULATORY_CARE_PROVIDER_SITE_OTHER): Payer: Medicare Other | Admitting: Internal Medicine

## 2016-02-25 ENCOUNTER — Encounter: Payer: Self-pay | Admitting: Internal Medicine

## 2016-02-25 ENCOUNTER — Encounter: Payer: Self-pay | Admitting: Gastroenterology

## 2016-02-25 VITALS — BP 142/82 | HR 75 | Temp 98.1°F | Resp 20 | Wt 181.0 lb

## 2016-02-25 DIAGNOSIS — M7989 Other specified soft tissue disorders: Secondary | ICD-10-CM

## 2016-02-25 DIAGNOSIS — Z0000 Encounter for general adult medical examination without abnormal findings: Secondary | ICD-10-CM

## 2016-02-25 DIAGNOSIS — R938 Abnormal findings on diagnostic imaging of other specified body structures: Secondary | ICD-10-CM

## 2016-02-25 DIAGNOSIS — I5030 Unspecified diastolic (congestive) heart failure: Secondary | ICD-10-CM

## 2016-02-25 DIAGNOSIS — R21 Rash and other nonspecific skin eruption: Secondary | ICD-10-CM | POA: Insufficient documentation

## 2016-02-25 DIAGNOSIS — I10 Essential (primary) hypertension: Secondary | ICD-10-CM

## 2016-02-25 DIAGNOSIS — R1310 Dysphagia, unspecified: Secondary | ICD-10-CM

## 2016-02-25 DIAGNOSIS — R7989 Other specified abnormal findings of blood chemistry: Secondary | ICD-10-CM

## 2016-02-25 DIAGNOSIS — R9389 Abnormal findings on diagnostic imaging of other specified body structures: Secondary | ICD-10-CM

## 2016-02-25 DIAGNOSIS — R945 Abnormal results of liver function studies: Secondary | ICD-10-CM

## 2016-02-25 DIAGNOSIS — Z20828 Contact with and (suspected) exposure to other viral communicable diseases: Secondary | ICD-10-CM

## 2016-02-25 LAB — CBC WITH DIFFERENTIAL/PLATELET
BASOS ABS: 0.1 10*3/uL (ref 0.0–0.1)
BASOS PCT: 1.5 % (ref 0.0–3.0)
EOS PCT: 2 % (ref 0.0–5.0)
Eosinophils Absolute: 0.2 10*3/uL (ref 0.0–0.7)
HCT: 43.4 % (ref 39.0–52.0)
HEMOGLOBIN: 15.1 g/dL (ref 13.0–17.0)
LYMPHS ABS: 1.7 10*3/uL (ref 0.7–4.0)
Lymphocytes Relative: 21.9 % (ref 12.0–46.0)
MCHC: 34.9 g/dL (ref 30.0–36.0)
MCV: 89.9 fl (ref 78.0–100.0)
MONO ABS: 0.6 10*3/uL (ref 0.1–1.0)
Monocytes Relative: 7.6 % (ref 3.0–12.0)
NEUTROS PCT: 67 % (ref 43.0–77.0)
Neutro Abs: 5.3 10*3/uL (ref 1.4–7.7)
Platelets: 202 10*3/uL (ref 150.0–400.0)
RBC: 4.83 Mil/uL (ref 4.22–5.81)
RDW: 13.3 % (ref 11.5–15.5)
WBC: 8 10*3/uL (ref 4.0–10.5)

## 2016-02-25 LAB — URINALYSIS, ROUTINE W REFLEX MICROSCOPIC
BILIRUBIN URINE: NEGATIVE
Hgb urine dipstick: NEGATIVE
KETONES UR: NEGATIVE
LEUKOCYTES UA: NEGATIVE
NITRITE: NEGATIVE
PH: 7 (ref 5.0–8.0)
RBC / HPF: NONE SEEN (ref 0–?)
Specific Gravity, Urine: 1.015 (ref 1.000–1.030)
TOTAL PROTEIN, URINE-UPE24: NEGATIVE
URINE GLUCOSE: NEGATIVE
UROBILINOGEN UA: 0.2 (ref 0.0–1.0)

## 2016-02-25 LAB — C-REACTIVE PROTEIN: CRP: 0.2 mg/dL — ABNORMAL LOW (ref 0.5–20.0)

## 2016-02-25 LAB — HEPATIC FUNCTION PANEL
ALBUMIN: 4.6 g/dL (ref 3.5–5.2)
ALK PHOS: 107 U/L (ref 39–117)
ALT: 19 U/L (ref 0–53)
AST: 21 U/L (ref 0–37)
Bilirubin, Direct: 0.2 mg/dL (ref 0.0–0.3)
TOTAL PROTEIN: 7.1 g/dL (ref 6.0–8.3)
Total Bilirubin: 0.8 mg/dL (ref 0.2–1.2)

## 2016-02-25 LAB — BASIC METABOLIC PANEL
BUN: 17 mg/dL (ref 6–23)
CALCIUM: 9.7 mg/dL (ref 8.4–10.5)
CO2: 30 meq/L (ref 19–32)
Chloride: 104 mEq/L (ref 96–112)
Creatinine, Ser: 0.95 mg/dL (ref 0.40–1.50)
GFR: 81.49 mL/min (ref >60.00)
GLUCOSE: 88 mg/dL (ref 70–99)
Potassium: 4 mEq/L (ref 3.5–5.1)
SODIUM: 142 meq/L (ref 135–145)

## 2016-02-25 LAB — SEDIMENTATION RATE: Sed Rate: 3 mm/hr (ref 0–22)

## 2016-02-25 LAB — BRAIN NATRIURETIC PEPTIDE: PRO B NATRI PEPTIDE: 59 pg/mL (ref 0.0–100.0)

## 2016-02-25 MED ORDER — PANTOPRAZOLE SODIUM 40 MG PO TBEC: 40.0000 mg | DELAYED_RELEASE_TABLET | Freq: Every day | ORAL | Status: DC

## 2016-02-25 NOTE — Progress Notes (Signed)
Subjective:    Patient ID: Connor Morgan, male    DOB: 19-Nov-1936, 80 y.o.   MRN: [id number]  HPI  Here with c/o moderate persistent bilat LE rash and swelling 5 days (with rash onset 2 days prior to swelling), assoc with itching right > left, no better with cocount oil (except for the itching) and no better with stopping the statin which he read might be a cause.  The only time swelling will improve is with sitting with feet propped up higher than waist level.  Pt denies chest pain, increased sob or doe, wheezing, orthopnea, PND, increased LE swelling, palpitations, dizziness or syncope, except for the above.  Pt denies new neurological symptoms such as new headache, or facial or extremity weakness or numbness   Pt denies polydipsia, polyuria - not drinking more fluids lately/    Pt denies fever, wt loss, night sweats, loss of appetite, or other constitutional symptoms,  Has stable nocturia several times per night, and stable daytime urinary freq every 30-60 min.  Last EKG 2015, no other echo, stress test.  Last cxr 2013  - NAD  Also, has reconsidered his position on f/u imaging since had recommendation for f/u CT chest with CM as outpt after [facility] 2017.  No records avaiabe except for the lay person d/c papers indicating the recommendation for the CT to be done. .  Also mentions regular problem many months with burning discomfort after 3-4 bites only to lower chest, like a "hard pain"  Eases off after 3-4 min, resumes eating. Seems like at end of meals, overall food "getting all the way down" may take several hours.  Denies worsening reflux, other abd pain, n/v, bowel change or blood.  Wife had similar issue after lap band surgury.  Pt has hx of PUD at 80 yo secondary to moonshine intake. Past Medical History  Diagnosis Date  . ALLERGIC RHINITIS 10/19/2007  . ALLERGY 08/13/2007  . BENIGN PROSTATIC HYPERTROPHY 10/19/2007  . CHEST PAIN, ATYPICAL 12/03/2008  . DYSPNEA 11/20/2008    . ERECTILE DYSFUNCTION 10/19/2007  . FATIGUE 10/19/2007  . FREQUENCY, URINARY 11/20/2008  . GERD 10/19/2007  . GLUCOSE INTOLERANCE 10/19/2007  . HEMORRHOIDS, HX OF 08/13/2007  . HYPERLIPIDEMIA 10/19/2007  . HYPERTENSION 08/13/2007  . NEPHROLITHIASIS, HX OF 10/19/2007  . NEPHROLITHIASIS 08/13/2007  . ORCHIECTOMY, HX OF 10/19/2007  . Spermatocele 08/13/2007  . Impaired glucose tolerance 07/08/2011   Past Surgical History  Procedure Laterality Date  . S/p right orchiectomy      benign mass, right  . Inguinal herniorrhapy\      reports that he has quit smoking. He does not have any smokeless tobacco history on file. He reports that he does not drink alcohol. His drug history is not on file. family history includes Dementia in his father and mother; Heart disease in his father. Allergies  Allergen Reactions  . Lovastatin     REACTION: myalgias   Current Outpatient Prescriptions on File Prior to Visit  Medication Sig Dispense Refill  . amLODipine (NORVASC) 5 MG tablet Take 1 tablet (5 mg total) by mouth daily. 90 tablet 3  . aspirin 81 MG tablet Take 81 mg by mouth daily.      Marland Kitchen atorvastatin (LIPITOR) 10 MG tablet Take 1 tablet (10 mg total) by mouth daily. 90 tablet 3  . ondansetron (ZOFRAN) 8 MG tablet Take 1 tablet (8 mg total) by mouth every 8 (eight) hours as needed for nausea or vomiting. [place]  tablet 1  . triamcinolone cream (KENALOG) 0.1 % Apply 1 application topically 2 (two) times daily. 30 g 0   No current facility-administered medications on file prior to visit.    Review of Systems  Constitutional: Negative for unusual diaphoresis or night sweats HENT: Negative for ringing in ear or discharge Eyes: Negative for double vision or worsening visual disturbance.  Respiratory: Negative for choking and stridor.   Gastrointestinal: Negative for vomiting or other signifcant bowel change Genitourinary: Negative for hematuria or change in urine volume.  Musculoskeletal: Negative for  other MSK pain or swelling Skin: Negative for color change and worsening wound.  Neurological: Negative for tremors and numbness other than noted  Psychiatric/Behavioral: Negative for decreased concentration or agitation other than above       Objective:   Physical Exam BP 142/82 mmHg  Pulse 75  Temp(Src) 98.1 F (36.7 C) (Oral)  Resp 20  Wt 181 lb (82.101 kg)  SpO2 96% VS noted, non toxic Constitutional: Pt appears in no significant distress HENT: Head: NCAT.  Right Ear: External ear normal.  Left Ear: External ear normal.  Eyes: . Pupils are equal, round, and reactive to light. Conjunctivae and EOM are normal Neck: Normal range of motion. Neck supple.  Cardiovascular: Normal rate and regular rhythm.   Pulmonary/Chest: Effort normal and breath sounds without rales or wheezing.  Abd:  Soft, NT, ND, + BS Neurological: Pt is alert. Not confused , motor grossly intact Skin: Skin is warm. No rash, 1+ LE edema bilat nearly to knees with petechial type rash pretibial bilat right > left overall at least 6 cm x 2-3 cm area bilat, nontender, no ulcer, no red streaks or drainage Dorsalis pedis 1+ bilat Psychiatric: Pt behavior is normal. No agitation.   Recent labs: Lab Results  Component Value Date   WBC 7.7 01/03/2016   HGB 16.1 01/03/2016   HCT 47.4 01/03/2016   PLT 216.0 01/03/2016   GLUCOSE 92 01/03/2016   CHOL 144 01/03/2016   TRIG 94.0 01/03/2016   HDL 54.90 01/03/2016   LDLDIRECT 128.8 10/19/2007   LDLCALC 71 01/03/2016   ALT 16 01/03/2016   AST 18 01/03/2016   NA 142 01/03/2016   K 4.2 01/03/2016   CL 103 01/03/2016   CREATININE 0.78 01/03/2016   BUN 15 01/03/2016   CO2 33* 01/03/2016   TSH 1.74 01/03/2016   PSA 2.21 01/03/2016   HGBA1C 4.9 01/03/2016   ECG reviewed as per emr    Assessment & Plan:

## 2016-02-25 NOTE — Assessment & Plan Note (Signed)
Details unclear as xray was done jan 2017 at [facility], but will defer to pt, ok for CT chest w CM for further evaluation

## 2016-02-25 NOTE — Assessment & Plan Note (Addendum)
May be simply secondary to the rash etiology, but cant r/o other, ECG reviewed as per emr, also for echo - r/o diast chf or other, also for labs today as ordered  Note:  Total time for pt hx, exam, review of record with pt in the room, determination of diagnoses and plan for further eval and tx is > 40 min, with over 50% spent in coordination and counseling of patient  ECG reviewed as per emr

## 2016-02-25 NOTE — Patient Instructions (Addendum)
Please take all new medication as prescribed - the protonix (antacid)  Please continue all other medications as before, and refills have been done if requested.  Please have the pharmacy call with any other refills you may need.  Please continue your efforts at being more active, low cholesterol diet, and weight control.  You are otherwise up to date with prevention measures today.  Please keep your appointments with your specialists as you may have planned  You will be contacted regarding the referral for: CT scan chest (to see [facility] now please), and Gastroenterology about the swallowing, and Dermatology about the rash  Please go to the LAB in the Basement (turn left off the elevator) for the tests to be done today  You will be contacted by phone if any changes need to be made immediately.  Otherwise, you will receive a letter about your results with an explanation, but please check with MyChart first.  Please remember to sign up for MyChart if you have not done so, as this will be important to you in the future with finding out test results, communicating by private email, and scheduling acute appointments online when needed.  Please return in 2 weeks, or sooner if needed

## 2016-02-25 NOTE — Progress Notes (Signed)
Pre visit review using our clinic review tool, if applicable. No additional management support is needed unless otherwise documented below in the visit note. 

## 2016-02-25 NOTE — Assessment & Plan Note (Signed)
Etiology unclear, has some pruritis but do not suspect contact dermatitis, diff would include stasis dermatitis related to the swelling, or vasculitis or other, for labs as ordered, also Derm referral

## 2016-02-25 NOTE — Assessment & Plan Note (Signed)
Borderline elev, o/w stable overall by history and exam, recent data reviewed with pt, and pt to continue medical treatment as before,  to f/u any worsening symptoms or concerns BP Readings from Last 3 Encounters:  02/25/16 142/82  01/03/16 142/86  03/14/14 140/90

## 2016-02-25 NOTE — Assessment & Plan Note (Signed)
I suspect this is incidental problem to other complaints today, but will start PPI, refer GI - may need EGD

## 2016-02-26 ENCOUNTER — Encounter: Payer: Self-pay | Admitting: Internal Medicine

## 2016-02-26 ENCOUNTER — Ambulatory Visit (INDEPENDENT_AMBULATORY_CARE_PROVIDER_SITE_OTHER)
Admission: RE | Admit: 2016-02-26 | Discharge: 2016-02-26 | Disposition: A | Discharge status: Home / Self Care | Payer: Medicare Other | Source: Ambulatory Visit | Attending: Internal Medicine | Admitting: Internal Medicine

## 2016-02-26 ENCOUNTER — Telehealth: Payer: Self-pay | Admitting: Internal Medicine

## 2016-02-26 DIAGNOSIS — R21 Rash and other nonspecific skin eruption: Secondary | ICD-10-CM

## 2016-02-26 DIAGNOSIS — R918 Other nonspecific abnormal finding of lung field: Secondary | ICD-10-CM | POA: Diagnosis not present

## 2016-02-26 DIAGNOSIS — R938 Abnormal findings on diagnostic imaging of other specified body structures: Secondary | ICD-10-CM

## 2016-02-26 DIAGNOSIS — L989 Disorder of the skin and subcutaneous tissue, unspecified: Secondary | ICD-10-CM

## 2016-02-26 DIAGNOSIS — R9389 Abnormal findings on diagnostic imaging of other specified body structures: Secondary | ICD-10-CM

## 2016-02-26 LAB — C3 AND C4
C3 COMPLEMENT: 127 mg/dL (ref 90–180)
C4 COMPLEMENT: 28 mg/dL (ref 10–40)

## 2016-02-26 LAB — ANA: Anti Nuclear Antibody(ANA): POSITIVE — AB

## 2016-02-26 LAB — ANCA SCREEN W REFLEX TITER: ANCA Screen: NEGATIVE

## 2016-02-26 LAB — HEPATITIS B SURFACE ANTIBODY,QUALITATIVE: HEP B S AB: NEGATIVE

## 2016-02-26 LAB — ANTI-NUCLEAR AB-TITER (ANA TITER)

## 2016-02-26 LAB — HEPATITIS C ANTIBODY: HCV AB: NEGATIVE

## 2016-02-26 MED ORDER — IOPAMIDOL (ISOVUE-300) INJECTION 61%
80.0000 mL | Freq: Once | INTRAVENOUS | Status: AC | PRN
  Administered 2016-02-26: 80 mL via INTRAVENOUS

## 2016-02-26 NOTE — Telephone Encounter (Signed)
Pt was seen yesterday and a referral for a dermatologist and a cream for his rash was supposed to be put in and neither has been done.

## 2016-02-27 ENCOUNTER — Other Ambulatory Visit: Payer: Self-pay

## 2016-02-27 MED ORDER — TRIAMCINOLONE ACETONIDE 0.1 % EX CREA: 1.0000 "application " | TOPICAL_CREAM | Freq: Two times a day (BID) | CUTANEOUS | Status: DC

## 2016-02-27 NOTE — Telephone Encounter (Signed)
Please advise, I reordered the medication he just needs the referral for the dermatologist

## 2016-02-27 NOTE — Telephone Encounter (Signed)
Kenalog was ordered at time of service mar 23  I will re-order  OK for derm referral

## 2016-02-28 ENCOUNTER — Other Ambulatory Visit: Payer: Self-pay | Admitting: Internal Medicine

## 2016-02-28 ENCOUNTER — Encounter: Payer: Self-pay | Admitting: Internal Medicine

## 2016-02-28 DIAGNOSIS — R21 Rash and other nonspecific skin eruption: Secondary | ICD-10-CM

## 2016-02-28 DIAGNOSIS — R768 Other specified abnormal immunological findings in serum: Secondary | ICD-10-CM

## 2016-03-01 LAB — CRYOGLOBULIN

## 2016-03-03 DIAGNOSIS — I8310 Varicose veins of unspecified lower extremity with inflammation: Secondary | ICD-10-CM | POA: Diagnosis not present

## 2016-03-03 DIAGNOSIS — R600 Localized edema: Secondary | ICD-10-CM | POA: Diagnosis not present

## 2016-03-11 ENCOUNTER — Encounter: Payer: Self-pay | Admitting: Internal Medicine

## 2016-03-11 ENCOUNTER — Ambulatory Visit (INDEPENDENT_AMBULATORY_CARE_PROVIDER_SITE_OTHER): Payer: Medicare Other | Admitting: Internal Medicine

## 2016-03-11 VITALS — BP 120/82 | HR 65 | Temp 97.7°F | Resp 20 | Wt 176.0 lb

## 2016-03-11 DIAGNOSIS — M7989 Other specified soft tissue disorders: Secondary | ICD-10-CM | POA: Diagnosis not present

## 2016-03-11 DIAGNOSIS — R1310 Dysphagia, unspecified: Secondary | ICD-10-CM

## 2016-03-11 DIAGNOSIS — R768 Other specified abnormal immunological findings in serum: Secondary | ICD-10-CM | POA: Diagnosis not present

## 2016-03-11 DIAGNOSIS — R21 Rash and other nonspecific skin eruption: Secondary | ICD-10-CM | POA: Diagnosis not present

## 2016-03-11 DIAGNOSIS — I10 Essential (primary) hypertension: Secondary | ICD-10-CM

## 2016-03-11 MED ORDER — PANTOPRAZOLE SODIUM 40 MG PO TBEC: 40.0000 mg | DELAYED_RELEASE_TABLET | Freq: Every day | ORAL | Status: DC

## 2016-03-11 NOTE — Patient Instructions (Addendum)
Please take all new medication as prescribed- the protonix  OK to stop the mupirocin topical antibiotic  Please continue all other medications as before, including the norvasc, and continue the compression stockings and leg elevation  OK to cancel the Rheumatology appt for May, as you do not need to go to this  Please have the pharmacy call with any other refills you may need.  Please keep your appointments with your specialists as you may have planned - the heart test tomorrow, and GI appt in May

## 2016-03-11 NOTE — Progress Notes (Signed)
Pre visit review using our clinic review tool, if applicable. No additional management support is needed unless otherwise documented below in the visit note. 

## 2016-03-11 NOTE — Progress Notes (Signed)
Subjective:    Patient ID: Connor Morgan, male    DOB: 1936/11/01, 80 y.o.   MRN: [id number]  HPI  Here to f/u recent LE swelling and rash; has seen derm, tx with topical mupirocin asd, also with suspicion of venous insufficiency, LE edema much improved, Rash resolved, no significant pain or fever, now also wearing compression stockings daily.  Pt denies chest pain, increased sob or doe, wheezing, orthopnea, PND, increased LE swelling, palpitations, dizziness or syncope.  Still with worsening reflux, abd pain, dysphagia, n/v, bowel change or blood, Has appt with GI in May   Never started the protonix as it seems he had some confusion about this.  Had mult labs and + ANA.  Echo planned for tomorrow.  CT chest proved neg for acute. Past Medical History  Diagnosis Date  . ALLERGIC RHINITIS 10/19/2007  . ALLERGY 08/13/2007  . BENIGN PROSTATIC HYPERTROPHY 10/19/2007  . CHEST PAIN, ATYPICAL 12/03/2008  . DYSPNEA 11/20/2008  . ERECTILE DYSFUNCTION 10/19/2007  . FATIGUE 10/19/2007  . FREQUENCY, URINARY 11/20/2008  . GERD 10/19/2007  . GLUCOSE INTOLERANCE 10/19/2007  . HEMORRHOIDS, HX OF 08/13/2007  . HYPERLIPIDEMIA 10/19/2007  . HYPERTENSION 08/13/2007  . NEPHROLITHIASIS, HX OF 10/19/2007  . NEPHROLITHIASIS 08/13/2007  . ORCHIECTOMY, HX OF 10/19/2007  . Spermatocele 08/13/2007  . Impaired glucose tolerance 07/08/2011   Past Surgical History  Procedure Laterality Date  . S/p right orchiectomy      benign mass, right  . Inguinal herniorrhapy\      reports that he has quit smoking. He does not have any smokeless tobacco history on file. He reports that he does not drink alcohol. His drug history is not on file. family history includes Dementia in his father and mother; Heart disease in his father. Allergies  Allergen Reactions  . Lovastatin     REACTION: myalgias   Current Outpatient Prescriptions on File Prior to Visit  Medication Sig Dispense Refill  . amLODipine (NORVASC) 5 MG tablet Take 1  tablet (5 mg total) by mouth daily. 90 tablet 3  . aspirin 81 MG tablet Take 81 mg by mouth daily.      Marland Kitchen atorvastatin (LIPITOR) 10 MG tablet Take 1 tablet (10 mg total) by mouth daily. 90 tablet 3  . ondansetron (ZOFRAN) 8 MG tablet Take 1 tablet (8 mg total) by mouth every 8 (eight) hours as needed for nausea or vomiting. 30 tablet 1  . triamcinolone cream (KENALOG) 0.1 % Apply 1 application topically 2 (two) times daily. 30 g 0   No current facility-administered medications on file prior to visit.   Review of Systems  Constitutional: Negative for unusual diaphoresis or night sweats HENT: Negative for ear swelling or discharge Eyes: Negative for worsening visual haziness  Respiratory: Negative for choking and stridor.   Gastrointestinal: Negative for distension or worsening eructation Genitourinary: Negative for retention or change in urine volume.  Musculoskeletal: Negative for other MSK pain or swelling Skin: Negative for color change and worsening wound Neurological: Negative for tremors and numbness other than noted  Psychiatric/Behavioral: Negative for decreased concentration or agitation other than above       Objective:   Physical Exam BP 120/82 mmHg  Pulse 65  Temp(Src) 97.7 F (36.5 C) (Oral)  Resp 20  Wt 176 lb (79.833 kg)  SpO2 98% VS noted,  Constitutional: Pt appears in no apparent distress HENT: Head: NCAT.  Right Ear: External ear normal.  Left Ear: External ear normal.  Eyes: . Pupils  are equal, round, and reactive to light. Conjunctivae and EOM are normal Neck: Normal range of motion. Neck supple.  Cardiovascular: Normal rate and regular rhythm.   Pulmonary/Chest: Effort normal and breath sounds without rales or wheezing.  Abd:  Soft, NT, ND, + BS Neurological: Pt is alert. Not confused , motor grossly intact Skin: Skin is warm. No rash, no LE edema, wearing compression stocking Psychiatric: Pt behavior is normal. No agitation.     Assessment & Plan:

## 2016-03-12 ENCOUNTER — Ambulatory Visit (HOSPITAL_COMMUNITY): Payer: Medicare Other | Attending: Cardiology

## 2016-03-12 ENCOUNTER — Other Ambulatory Visit: Payer: Self-pay

## 2016-03-12 DIAGNOSIS — I358 Other nonrheumatic aortic valve disorders: Secondary | ICD-10-CM | POA: Diagnosis not present

## 2016-03-12 DIAGNOSIS — E785 Hyperlipidemia, unspecified: Secondary | ICD-10-CM | POA: Diagnosis not present

## 2016-03-12 DIAGNOSIS — I5030 Unspecified diastolic (congestive) heart failure: Secondary | ICD-10-CM | POA: Diagnosis not present

## 2016-03-12 DIAGNOSIS — R768 Other specified abnormal immunological findings in serum: Secondary | ICD-10-CM | POA: Insufficient documentation

## 2016-03-12 DIAGNOSIS — I110 Hypertensive heart disease with heart failure: Secondary | ICD-10-CM | POA: Diagnosis not present

## 2016-03-12 DIAGNOSIS — I5031 Acute diastolic (congestive) heart failure: Secondary | ICD-10-CM | POA: Diagnosis present

## 2016-03-12 NOTE — Assessment & Plan Note (Signed)
Ok to start PPI, for GI as planned

## 2016-03-12 NOTE — Assessment & Plan Note (Signed)
Resolved after topical antibx,  to f/u any worsening symptoms or concerns

## 2016-03-12 NOTE — Assessment & Plan Note (Signed)
Pt has been referred to rheum, but with rash resolved, suspect infectious now improved after mupirocin tx, ok to d/c rheum referral

## 2016-03-12 NOTE — Assessment & Plan Note (Signed)
Resolved, does have echo planned tomorrow,  to f/u any worsening symptoms or concerns

## 2016-03-12 NOTE — Assessment & Plan Note (Signed)
stable overall by history and exam, recent data reviewed with pt, and pt to continue medical treatment as before,  to f/u any worsening symptoms or concerns, doubt norvasc related to swelling

## 2016-03-13 ENCOUNTER — Encounter: Payer: Self-pay | Admitting: Internal Medicine

## 2016-04-13 DIAGNOSIS — I8310 Varicose veins of unspecified lower extremity with inflammation: Secondary | ICD-10-CM | POA: Diagnosis not present

## 2016-04-13 DIAGNOSIS — R600 Localized edema: Secondary | ICD-10-CM | POA: Diagnosis not present

## 2016-04-13 DIAGNOSIS — L820 Inflamed seborrheic keratosis: Secondary | ICD-10-CM | POA: Diagnosis not present

## 2016-04-13 DIAGNOSIS — D485 Neoplasm of uncertain behavior of skin: Secondary | ICD-10-CM | POA: Diagnosis not present

## 2016-04-17 ENCOUNTER — Ambulatory Visit (INDEPENDENT_AMBULATORY_CARE_PROVIDER_SITE_OTHER): Payer: Medicare Other | Admitting: Gastroenterology

## 2016-04-17 ENCOUNTER — Encounter: Payer: Self-pay | Admitting: Gastroenterology

## 2016-04-17 VITALS — BP 128/74 | HR 68 | Ht 69.25 in | Wt 179.4 lb

## 2016-04-17 DIAGNOSIS — R1310 Dysphagia, unspecified: Secondary | ICD-10-CM | POA: Diagnosis not present

## 2016-04-17 NOTE — Progress Notes (Signed)
[facility] Consult Note:  History: Connor Morgan 04/17/2016  Referring physician: Cathlean Cower, MD  Reason for consult/chief complaint: Abdominal Pain and throat clearing   Subjective HPI:  This patient was sent for consultation due to chronic dysphagia. He describes several years of solid food dysphagia that seems to be getting slowly worse. He also at times has pain in the center of his chest soon after eating, may last a few minutes before slowly resolving. He also feels a frequent need to clear his throat and is concerned it may have something to do with his upper digestive symptoms. He is been on Protonix once a day for several months with no improvement in symptoms. He denies odynophagia, nausea, vomiting, early satiety or weight loss. Yrs (getting worse) solid dysphagia and pai nwith eating (at beginning), also clearing  ROS:  Review of Systems  Constitutional: Negative for appetite change and unexpected weight change.  HENT: Negative for mouth sores and voice change.   Eyes: Negative for pain and redness.  Respiratory: Negative for cough and shortness of breath.   Cardiovascular: Negative for chest pain and palpitations.  Genitourinary: Positive for frequency. Negative for dysuria and hematuria.  Musculoskeletal: Negative for myalgias and arthralgias.  Skin: Negative for pallor and rash.  Neurological: Negative for weakness and headaches.  Hematological: Negative for adenopathy.   He denies allergic symptoms of congestion or postnasal drip.  Past Medical History: Past Medical History  Diagnosis Date  . ALLERGIC RHINITIS 10/19/2007  . ALLERGY 08/13/2007  . BENIGN PROSTATIC HYPERTROPHY 10/19/2007  . CHEST PAIN, ATYPICAL 12/03/2008  . DYSPNEA 11/20/2008  . ERECTILE DYSFUNCTION 10/19/2007  . FATIGUE 10/19/2007  . FREQUENCY, URINARY 11/20/2008  . GERD 10/19/2007  . GLUCOSE INTOLERANCE 10/19/2007  . HEMORRHOIDS, HX OF 08/13/2007  . HYPERLIPIDEMIA 10/19/2007  .  HYPERTENSION 08/13/2007  . NEPHROLITHIASIS, HX OF 10/19/2007  . NEPHROLITHIASIS 08/13/2007  . ORCHIECTOMY, HX OF 10/19/2007  . Spermatocele 08/13/2007  . Impaired glucose tolerance 07/08/2011  . Gastric ulcer      Past Surgical History: Past Surgical History  Procedure Laterality Date  . S/p right orchiectomy      benign mass, right  . Inguinal hernia repair Bilateral   . Tonsillectomy    . Skin lesion excision      neck     Family History: Family History  Problem Relation Age of Onset  . Dementia Mother   . Heart disease Father   . Dementia Father   . Kidney disease Mother   . Heart disease Brother     x 2    Social History: Social History   Social History  . Marital Status: Married    Spouse Name: N/A  . Number of Children: 5  . Years of Education: N/A   Occupational History  . previously worked in Personal assistant and Cabin crew    Social History Main Topics  . Smoking status: Former Research scientist (life sciences)  . Smokeless tobacco: Never Used  . Alcohol Use: No  . Drug Use: No  . Sexual Activity: Not Asked   Other Topics Concern  . None   Social History Narrative    Allergies: Allergies  Allergen Reactions  . Mevacor [Lovastatin]     REACTION: myalgias    Outpatient Meds: Current Outpatient Prescriptions  Medication Sig Dispense Refill  . amLODipine (NORVASC) 5 MG tablet Take 1 tablet (5 mg total) by mouth daily. 90 tablet 3  . aspirin 81 MG tablet Take 81 mg by mouth daily.      Marland Kitchen  atorvastatin (LIPITOR) 10 MG tablet Take 1 tablet (10 mg total) by mouth daily. 90 tablet 3  . pantoprazole (PROTONIX) 40 MG tablet Take 1 tablet (40 mg total) by mouth daily. 90 tablet 3  . triamcinolone cream (KENALOG) 0.1 % Apply 1 application topically 2 (two) times daily. 30 g 0   No current facility-administered medications for this visit.      ___________________________________________________________________ Objective  Exam:  BP 128/74 mmHg  Pulse 68  Ht 5' 9.25" (1.759  m)  Wt 179 lb 6 oz (81.364 kg)  BMI 26.30 kg/m2   General: this is a(n) Well-appearing elderly man with good muscle mass, normal vocal quality   Eyes: sclera anicteric, no redness  ENT: oral mucosa moist without lesions, no cervical or supraclavicular lymphadenopathy, good dentition  CV: RRR without murmur, S1/S2, no JVD, mild peripheral edema  Resp: clear to auscultation bilaterally, normal RR and effort noted  GI: soft, no tenderness, with active bowel sounds. No guarding or palpable organomegaly noted.  Skin; warm and dry, no rash or jaundice noted  Neuro: awake, alert and oriented x 3. Normal gross motor function and fluent speech  Assessment: Encounter Diagnosis  Name Primary?  Marland Kitchen Dysphagia Yes    It is difficult to tell if his dysphagia is mechanical such as a ring/stricture or motility conditions such as esophageal spasm. It is also not clear if his throat clearing is related to this. Plan:  Upper endoscopy with possible dilation  The benefits and risks of the planned procedure were described in detail with the patient or (when appropriate) their health care proxy.  Risks were outlined as including, but not limited to, bleeding, infection, perforation, adverse medication reaction leading to cardiac or pulmonary decompensation, or pancreatitis (if ERCP).  The limitation of incomplete mucosal visualization was also discussed.  No guarantees or warranties were given.   Thank you for the courtesy of this consult.  Please call me with any questions or concerns.  Nelida Meuse III  CC: Cathlean Cower, MD

## 2016-04-17 NOTE — Patient Instructions (Signed)
You have been scheduled for an endoscopy. Please follow written instructions given to you at your visit today. If you use inhalers (even only as needed), please bring them with you on the day of your procedure. Your physician has requested that you go to www.startemmi.com and enter the access code given to you at your visit today. This web site gives a general overview about your procedure. However, you should still follow specific instructions given to you by our office regarding your preparation for the procedure.  If you are age 60 or older, your body mass index should be between 23-30. Your Body mass index is 26.3 kg/(m^2). If this is out of the aforementioned range listed, please consider follow up with your Primary Care Provider.  If you are age 54 or younger, your body mass index should be between 19-25. Your Body mass index is 26.3 kg/(m^2). If this is out of the aformentioned range listed, please consider follow up with your Primary Care Provider.   Thank you for choosing [facility]  Dr Wilfrid Lund III

## 2016-05-01 ENCOUNTER — Ambulatory Visit (AMBULATORY_SURGERY_CENTER): Payer: Medicare Other | Admitting: Gastroenterology

## 2016-05-01 ENCOUNTER — Encounter: Payer: Self-pay | Admitting: Gastroenterology

## 2016-05-01 VITALS — BP 134/74 | HR 66 | Temp 98.0°F | Resp 12 | Ht 69.25 in | Wt 179.0 lb

## 2016-05-01 DIAGNOSIS — R1310 Dysphagia, unspecified: Secondary | ICD-10-CM | POA: Diagnosis not present

## 2016-05-01 DIAGNOSIS — K649 Unspecified hemorrhoids: Secondary | ICD-10-CM | POA: Diagnosis not present

## 2016-05-01 DIAGNOSIS — N400 Enlarged prostate without lower urinary tract symptoms: Secondary | ICD-10-CM | POA: Diagnosis not present

## 2016-05-01 DIAGNOSIS — I10 Essential (primary) hypertension: Secondary | ICD-10-CM | POA: Diagnosis not present

## 2016-05-01 DIAGNOSIS — K219 Gastro-esophageal reflux disease without esophagitis: Secondary | ICD-10-CM | POA: Diagnosis not present

## 2016-05-01 MED ORDER — SODIUM CHLORIDE 0.9 % IV SOLN: 500.0000 mL | INTRAVENOUS | Status: DC

## 2016-05-01 NOTE — Op Note (Signed)
[facility] Patient Name: Yarnell Newby Procedure Date: 05/01/2016 10:45 AM MRN: [id number] Endoscopist: [place]. Loletha Carrow , MD Age: 80 Referring MD:  Date of Birth: 1936/01/25 Gender: Male Procedure:                Upper GI endoscopy Indications:              Dysphagia Medicines:                Monitored Anesthesia Care Procedure:                Pre-Anesthesia Assessment:                           - Prior to the procedure, a History and Physical                            was performed, and patient medications and                            allergies were reviewed. The patient's tolerance of                            previous anesthesia was also reviewed. The risks                            and benefits of the procedure and the sedation                            options and risks were discussed with the patient.                            All questions were answered, and informed consent                            was obtained. Prior Anticoagulants: The patient has                            taken no previous anticoagulant or antiplatelet                            agents. ASA Grade Assessment: II - A patient with                            mild systemic disease. After reviewing the risks                            and benefits, the patient was deemed in                            satisfactory condition to undergo the procedure.                           After obtaining informed consent, the endoscope was  passed under direct vision. Throughout the                            procedure, the patient's blood pressure, pulse, and                            oxygen saturations were monitored continuously. The                            Model GIF-HQ190 [id number]) scope was introduced                            through the mouth, and advanced to the second part                            of duodenum. The upper GI endoscopy was   accomplished without difficulty. The patient                            tolerated the procedure well. Scope In: Scope Out: Findings:                 The esophagus was normal.                           The stomach was normal, including on retroflexion.                           The examined duodenum was normal. Complications:            No immediate complications. Estimated Blood Loss:     Estimated blood loss: none. Impression:               - Normal esophagus.                           - Normal stomach.                           - Normal examined duodenum.                           - No specimens collected. Recommendation:           - Patient has a contact number available for                            emergencies. The signs and symptoms of potential                            delayed complications were discussed with the                            patient. Return to normal activities tomorrow.                            Written discharge instructions were provided to the  patient.                           - Resume previous diet.                           - Continue present medications.                           - Perform routine esophageal manometry. Kabria Hetzer L. Loletha Carrow, MD 05/01/2016 11:00:03 AM This report has been signed electronically.

## 2016-05-01 NOTE — Patient Instructions (Addendum)
YOU HAD AN ENDOSCOPIC PROCEDURE TODAY AT [facility]:   Refer to the procedure report that was given to you for any specific questions about what was found during the examination.  If the procedure report does not answer your questions, please call your gastroenterologist to clarify.  If you requested that your care partner not be given the details of your procedure findings, then the procedure report has been included in a sealed envelope for you to review at your convenience later.  YOU SHOULD EXPECT: Some feelings of bloating in the abdomen. Passage of more gas than usual.  Walking can help get rid of the air that was put into your GI tract during the procedure and reduce the bloating. If you had a lower endoscopy (such as a colonoscopy or flexible sigmoidoscopy) you may notice spotting of blood in your stool or on the toilet paper. If you underwent a bowel prep for your procedure, you may not have a normal bowel movement for a few days.  Please Note:  You might notice some irritation and congestion in your nose or some drainage.  This is from the oxygen used during your procedure.  There is no need for concern and it should clear up in a day or so.  SYMPTOMS TO REPORT IMMEDIATELY:   Following upper endoscopy (EGD)  Vomiting of blood or coffee ground material  New chest pain or pain under the shoulder blades  Painful or persistently difficult swallowing  New shortness of breath  Fever of 100F or higher  Black, tarry-looking stools  For urgent or emergent issues, a gastroenterologist can be reached at any hour by calling [phone].   DIET: Your first meal following the procedure should be a small meal and then it is ok to progress to your normal diet. Heavy or fried foods are harder to digest and may make you feel nauseous or bloated.  Likewise, meals heavy in dairy and vegetables can increase bloating.  Drink plenty of fluids but you should avoid alcoholic beverages for  24 hours.  ACTIVITY:  You should plan to take it easy for the rest of today and you should NOT DRIVE or use heavy machinery until tomorrow (because of the sedation medicines used during the test).    FOLLOW UP: Our staff will call the number listed on your records the next business day following your procedure to check on you and address any questions or concerns that you may have regarding the information given to you following your procedure. If we do not reach you, we will leave a message.  However, if you are feeling well and you are not experiencing any problems, there is no need to return our call.  We will assume that you have returned to your regular daily activities without incident.  If any biopsies were taken you will be contacted by phone or by letter within the next 1-3 weeks.  Please call us at [phone] if you have not heard about the biopsies in 3 weeks.    SIGNATURES/CONFIDENTIALITY: You and/or your care partner have signed paperwork which will be entered into your electronic medical record.  These signatures attest to the fact that that the information above on your After Visit Summary has been reviewed and is understood.  Full responsibility of the confidentiality of this discharge information lies with you and/or your care-partner.  Normal exam.  Routine Manometry, office will call with appointment date and time.

## 2016-05-05 ENCOUNTER — Telehealth: Payer: Self-pay

## 2016-05-05 NOTE — Telephone Encounter (Signed)
  Follow up Call-  Call back number 05/01/2016  Post procedure Call Back phone  # [phone]  Permission to leave phone message Yes     Patient questions:  Do you have a fever, pain , or abdominal swelling? No. Pain Score  0 *  Have you tolerated food without any problems? Yes.    Have you been able to return to your normal activities? Yes.    Do you have any questions about your discharge instructions: Diet   No. Medications  No. Follow up visit  No.  Do you have questions or concerns about your Care? No.  Actions: * If pain score is 4 or above: No action needed, pain <4.

## 2016-05-26 ENCOUNTER — Telehealth: Payer: Self-pay | Admitting: Internal Medicine

## 2016-05-26 ENCOUNTER — Encounter: Payer: Self-pay | Admitting: Internal Medicine

## 2016-05-26 ENCOUNTER — Ambulatory Visit (INDEPENDENT_AMBULATORY_CARE_PROVIDER_SITE_OTHER): Payer: Medicare Other | Admitting: Internal Medicine

## 2016-05-26 VITALS — BP 120/70 | HR 69 | Temp 98.7°F | Resp 16 | Ht 69.25 in | Wt 178.0 lb

## 2016-05-26 DIAGNOSIS — W57XXXA Bitten or stung by nonvenomous insect and other nonvenomous arthropods, initial encounter: Secondary | ICD-10-CM | POA: Diagnosis not present

## 2016-05-26 DIAGNOSIS — S40862A Insect bite (nonvenomous) of left upper arm, initial encounter: Secondary | ICD-10-CM | POA: Diagnosis not present

## 2016-05-26 DIAGNOSIS — Z23 Encounter for immunization: Secondary | ICD-10-CM

## 2016-05-26 MED ORDER — DOXYCYCLINE MONOHYDRATE 100 MG PO CAPS: 100.0000 mg | ORAL_CAPSULE | Freq: Two times a day (BID) | ORAL | Status: DC

## 2016-05-26 NOTE — Telephone Encounter (Signed)
Patient Name: Connor Morgan  DOB: 01/08/1936    Initial Comment Caller states found a tick on him yesterday, red around it. 95.7 temp, itching and stinging   Nurse Assessment  Nurse: Mallie Mussel, RN, Alveta Heimlich Date/Time (Eastern Time): 05/26/2016 9:59:19 AM  Confirm and document reason for call. If symptomatic, describe symptoms. You must click the next button to save text entered. ---Caller states that he found a tick on himself yesterday. He isn't sure he got the whole tick out, unsure he got the head. The tick was on his left arm. It is red around it about the size of a nickel, it itches and stings. Temp is 95.7 orally. I asked if he had another thermometer to where he could verify this. He does not feel cold. He states its not reading right. Denies fever and headache. He denies rashes.  Has the patient traveled out of the country within the last 30 days? ---No  Does the patient have any new or worsening symptoms? ---Yes  Will a triage be completed? ---Yes  Related visit to physician within the last 2 weeks? ---No  Does the PT have any chronic conditions? (i.e. diabetes, asthma, etc.) ---Yes  List chronic conditions. ---HTN  Is this a behavioral health or substance abuse call? ---No     Guidelines    Guideline Title Affirmed Question Affirmed Notes  Tick Bite Can't remove tick's head that was broken off in the skin (after trying Care Advice)    Final Disposition User   See Physician within 24 Hours Mallie Mussel, RN, Alveta Heimlich    Comments  Tick was not moving once he removed it. He is unsure if he got the head out, he doesn't think he got it though.  Caller had requested to be seen today if possible. Dr. Jenny Reichmann did not have any appointments available, but i was able to schedule him to see Dr. Scarlette Calico at 1:45pm today.   Referrals  REFERRED TO PCP OFFICE   Disagree/Comply: Comply

## 2016-05-26 NOTE — Telephone Encounter (Signed)
Noted  

## 2016-05-26 NOTE — Patient Instructions (Signed)
Tick Bite Information Ticks are insects that attach themselves to the skin and draw blood for food. There are various types of ticks. Common types include wood ticks and deer ticks. Most ticks live in shrubs and grassy areas. Ticks can climb onto your body when you make contact with leaves or grass where the tick is waiting. The most common places on the body for ticks to attach themselves are the scalp, neck, armpits, waist, and groin. Most tick bites are harmless, but sometimes ticks carry germs that cause diseases. These germs can be spread to a person during the tick's feeding process. The chance of a disease spreading through a tick bite depends on:   The type of tick.  Time of year.   How long the tick is attached.   Geographic location.  HOW CAN YOU PREVENT TICK BITES? Take these steps to help prevent tick bites when you are outdoors:  Wear protective clothing. Long sleeves and long pants are best.   Wear white clothes so you can see ticks more easily.  Tuck your pant legs into your socks.   If walking on a trail, stay in the middle of the trail to avoid brushing against bushes.  Avoid walking through areas with long grass.  Put insect repellent on all exposed skin and along boot tops, pant legs, and sleeve cuffs.   Check clothing, hair, and skin repeatedly and before going inside.   Brush off any ticks that are not attached.  Take a shower or bath as soon as possible after being outdoors.  WHAT IS THE PROPER WAY TO REMOVE A TICK? Ticks should be removed as soon as possible to help prevent diseases caused by tick bites. 1. If latex gloves are available, put them on before trying to remove a tick.  2. Using fine-point tweezers, grasp the tick as close to the skin as possible. You may also use curved forceps or a tick removal tool. Grasp the tick as close to its head as possible. Avoid grasping the tick on its body. 3. Pull gently with steady upward pressure until  the tick lets go. Do not twist the tick or jerk it suddenly. This may break off the tick's head or mouth parts. 4. Do not squeeze or crush the tick's body. This could force disease-carrying fluids from the tick into your body.  5. After the tick is removed, wash the bite area and your hands with soap and water or other disinfectant such as alcohol. 6. Apply a small amount of antiseptic cream or ointment to the bite site.  7. Wash and disinfect any instruments that were used.  Do not try to remove a tick by applying a hot match, petroleum jelly, or fingernail polish to the tick. These methods do not work and may increase the chances of disease being spread from the tick bite.  WHEN SHOULD YOU SEEK MEDICAL CARE? Contact your health care provider if you are unable to remove a tick from your skin or if a part of the tick breaks off and is stuck in the skin.  After a tick bite, you need to be aware of signs and symptoms that could be related to diseases spread by ticks. Contact your health care provider if you develop any of the following in the days or weeks after the tick bite:  Unexplained fever.  Rash. A circular rash that appears days or weeks after the tick bite may indicate the possibility of Lyme disease. The rash may resemble   a target with a bull's-eye and may occur at a different part of your body than the tick bite.  Redness and swelling in the area of the tick bite.   Tender, swollen lymph glands.   Diarrhea.   Weight loss.   Cough.   Fatigue.   Muscle, joint, or bone pain.   Abdominal pain.   Headache.   Lethargy or a change in your level of consciousness.  Difficulty walking or moving your legs.   Numbness in the legs.   Paralysis.  Shortness of breath.   Confusion.   Repeated vomiting.    This information is not intended to replace advice given to you by your health care provider. Make sure you discuss any questions you have with your health  care provider.   Document Released: 11/20/2000 Document Revised: 12/14/2014 Document Reviewed: 05/03/2013 Elsevier Interactive Patient Education 2016 [employer]  

## 2016-05-26 NOTE — Progress Notes (Signed)
Subjective:  Patient ID: Connor Morgan, male    DOB: [birth year]  Age: 80 y.o. MRN: [id number]  CC: Tick Removal   HPI Connor Morgan presents for concerns about a tick bite on his left upper arm. He tells me that one day prior to arrival he removed an engorged, bloody deer tick off his left upper inner arm. He thinks it had been there for a day or 2. He now complains of a red spot that feels both painful and itchy. He thinks he removed the deer tick in its entirety.  He denies headache, fever, chills, rash, abdominal pain, night sweats, lymphadenopathy, dysuria, or hematuria.  Outpatient Prescriptions Prior to Visit  Medication Sig Dispense Refill  . amLODipine (NORVASC) 5 MG tablet Take 1 tablet (5 mg total) by mouth daily. 90 tablet 3  . aspirin 81 MG tablet Take 81 mg by mouth daily.      Marland Kitchen atorvastatin (LIPITOR) 10 MG tablet Take 1 tablet (10 mg total) by mouth daily. 90 tablet 3  . pantoprazole (PROTONIX) 40 MG tablet Take 1 tablet (40 mg total) by mouth daily. 90 tablet 3  . triamcinolone cream (KENALOG) 0.1 % Apply 1 application topically 2 (two) times daily. 30 g 0   No facility-administered medications prior to visit.    ROS Review of Systems  Constitutional: Negative.  Negative for fever, chills, diaphoresis, appetite change and fatigue.  HENT: Negative.  Negative for sinus pressure, sore throat and trouble swallowing.   Eyes: Negative.  Negative for photophobia and visual disturbance.  Respiratory: Negative.  Negative for cough, choking, shortness of breath and stridor.   Cardiovascular: Negative.  Negative for chest pain, palpitations and leg swelling.  Gastrointestinal: Negative.  Negative for nausea, vomiting, abdominal pain and diarrhea.  Endocrine: Negative.   Genitourinary: Negative.   Musculoskeletal: Negative.  Negative for myalgias, back pain, joint swelling and neck pain.  Skin: Positive for wound. Negative for color change, pallor and rash.    Allergic/Immunologic: Negative.   Neurological: Negative.  Negative for dizziness, tremors, syncope, light-headedness, numbness and headaches.  Hematological: Negative.  Negative for adenopathy. Does not bruise/bleed easily.  Psychiatric/Behavioral: Negative.     Objective:  BP 120/70 mmHg  Pulse 69  Temp(Src) 98.7 F (37.1 C) (Oral)  Resp 16  Ht 5' 9.25" (1.759 m)  Wt 178 lb (80.74 kg)  BMI 26.09 kg/m2  SpO2 97%  BP Readings from Last 3 Encounters:  05/26/16 120/70  05/01/16 134/74  04/17/16 128/74    Wt Readings from Last 3 Encounters:  05/26/16 178 lb (80.74 kg)  05/01/16 179 lb (81.194 kg)  04/17/16 179 lb 6 oz (81.364 kg)    Physical Exam  Constitutional: He is oriented to person, place, and time.  Non-toxic appearance. He does not have a sickly appearance. He does not appear ill. No distress.  HENT:  Mouth/Throat: Oropharynx is clear and moist. No oropharyngeal exudate.  Eyes: Conjunctivae are normal. Right eye exhibits no discharge. Left eye exhibits no discharge. No scleral icterus.  Neck: Normal range of motion. Neck supple. No JVD present. No tracheal deviation present. No thyromegaly present.  Cardiovascular: Normal rate, regular rhythm, normal heart sounds and intact distal pulses.  Exam reveals no gallop and no friction rub.   No murmur heard. Pulmonary/Chest: Effort normal and breath sounds normal. No stridor. No respiratory distress. He has no wheezes. He has no rales. He exhibits no tenderness.  Abdominal: Soft. Bowel sounds are normal. He exhibits no distension  and no mass. There is no tenderness. There is no rebound and no guarding.  Musculoskeletal: Normal range of motion. He exhibits no edema or tenderness.       Arms: Lymphadenopathy:    He has no cervical adenopathy.  Neurological: He is oriented to person, place, and time.  Skin: Skin is warm and dry. No rash noted. He is not diaphoretic. No erythema. No pallor.    Lab Results  Component Value  Date   WBC 8.0 02/25/2016   HGB 15.1 02/25/2016   HCT 43.4 02/25/2016   PLT 202.0 02/25/2016   GLUCOSE 88 02/25/2016   CHOL 144 01/03/2016   TRIG 94.0 01/03/2016   HDL 54.90 01/03/2016   LDLDIRECT 128.8 10/19/2007   LDLCALC 71 01/03/2016   ALT 19 02/25/2016   AST 21 02/25/2016   NA 142 02/25/2016   K 4.0 02/25/2016   CL 104 02/25/2016   CREATININE 0.95 02/25/2016   BUN 17 02/25/2016   CO2 30 02/25/2016   TSH 1.74 01/03/2016   PSA 2.21 01/03/2016   HGBA1C 4.9 01/03/2016    Ct Chest W Contrast  02/26/2016  CLINICAL DATA:  Possible right infrahilar density on x-ray 12/26/2015 EXAM: CT CHEST WITH CONTRAST TECHNIQUE: Multidetector CT imaging of the chest was performed during intravenous contrast administration. CONTRAST:  76mL ISOVUE-300 IOPAMIDOL (ISOVUE-300) INJECTION 61% COMPARISON:  Chest x-ray [facility] 12/26/2015 and CT chest 12/03/2008 FINDINGS: Mediastinum/Lymph Nodes: Images of the thoracic inlet are unremarkable. Central airways are patent. Mild atherosclerotic calcifications of thoracic aorta. Atherosclerotic calcifications of coronary arteries. Heart size within normal limits. No pericardial effusion is noted. No mediastinal hematoma or adenopathy. No hilar adenopathy. No aortic aneurysm. Central pulmonary artery is unremarkable. Lungs/Pleura: There is no evidence of pleural thickening. Images of the lung parenchyma shows no acute infiltrate or pulmonary edema. There is mild prominent fat pad in right cardiophrenic angle most likely accounting for chest x-ray abnormality. No evidence of mass or pulmonary nodule. A single tiny emphysematous bulla is noted in right lower lobe centrally. Upper abdomen: There is mild fatty infiltration of the liver. No focal hepatic mass. No adrenal gland mass is noted. No calcified gallstones are noted within gallbladder. The visualized body and tail of the pancreas are unremarkable. Visualized spleen is unremarkable. Musculoskeletal: Sagittal  images of the thoracic spine are unremarkable. Minimal degenerative changes lower thoracic spine with anterior spurring. Sagittal view of the sternum is unremarkable. No destructive rib lesions are noted. IMPRESSION: 1. No acute infiltrate or pulmonary edema. No focal consolidation. Mild prominent fat pad in right cardiophrenic angle. 2. No mediastinal hematoma or adenopathy.  No hilar adenopathy. 3. Atherosclerotic calcifications of thoracic aorta and coronary arteries. 4. Mild fatty infiltration of the liver. Electronically Signed   By: Lahoma Crocker M.D.   On: 02/26/2016 10:03    Assessment & Plan:   Krisztian was seen today for tick removal.  Diagnoses and all orders for this visit:  Tick bite of left axillary region, initial encounter-  He has a localized allergic reaction but no evidence of retained foreign body, He was given a Tdap booster, Will prophylax against tickborne illness like RMSF and Lyme disease with a 3 week course of doxycycline, he will let me know if he develops any new symptoms. -     doxycycline (MONODOX) 100 MG capsule; Take 1 capsule (100 mg total) by mouth 2 (two) times daily. -     Tdap vaccine greater than or equal to 7yo IM  Need for  Tdap vaccination -     Tdap vaccine greater than or equal to 7yo IM   I am having Mr. Stucky start on doxycycline. I am also having him maintain his aspirin, amLODipine, atorvastatin, triamcinolone cream, and pantoprazole.  Meds ordered this encounter  Medications  . doxycycline (MONODOX) 100 MG capsule    Sig: Take 1 capsule (100 mg total) by mouth 2 (two) times daily.    Dispense:  42 capsule    Refill:  0     Follow-up: Return in about 3 weeks (around 06/16/2016).  Scarlette Calico, MD

## 2016-09-10 ENCOUNTER — Ambulatory Visit (INDEPENDENT_AMBULATORY_CARE_PROVIDER_SITE_OTHER): Payer: Medicare Other | Admitting: Internal Medicine

## 2016-09-10 ENCOUNTER — Encounter: Payer: Self-pay | Admitting: Internal Medicine

## 2016-09-10 ENCOUNTER — Other Ambulatory Visit (INDEPENDENT_AMBULATORY_CARE_PROVIDER_SITE_OTHER): Payer: Medicare Other

## 2016-09-10 VITALS — BP 140/80 | HR 52 | Temp 98.1°F | Resp 20 | Wt 176.0 lb

## 2016-09-10 DIAGNOSIS — E785 Hyperlipidemia, unspecified: Secondary | ICD-10-CM

## 2016-09-10 DIAGNOSIS — I5032 Chronic diastolic (congestive) heart failure: Secondary | ICD-10-CM | POA: Insufficient documentation

## 2016-09-10 DIAGNOSIS — I10 Essential (primary) hypertension: Secondary | ICD-10-CM

## 2016-09-10 DIAGNOSIS — R972 Elevated prostate specific antigen [PSA]: Secondary | ICD-10-CM | POA: Diagnosis not present

## 2016-09-10 DIAGNOSIS — R7302 Impaired glucose tolerance (oral): Secondary | ICD-10-CM

## 2016-09-10 DIAGNOSIS — R319 Hematuria, unspecified: Secondary | ICD-10-CM | POA: Insufficient documentation

## 2016-09-10 LAB — LIPID PANEL
CHOLESTEROL: 145 mg/dL (ref 0–200)
HDL: 54.4 mg/dL (ref >39.00)
LDL Cholesterol: 75 mg/dL (ref 0–99)
NonHDL: 90.8
Total CHOL/HDL Ratio: 3
Triglycerides: 81 mg/dL (ref 0.0–149.0)
VLDL: 16.2 mg/dL (ref 0.0–40.0)

## 2016-09-10 LAB — PSA: PSA: 1.64 ng/mL (ref 0.10–4.00)

## 2016-09-10 LAB — HEMOGLOBIN A1C: HEMOGLOBIN A1C: 5 % (ref 4.6–6.5)

## 2016-09-10 MED ORDER — HYDROCHLOROTHIAZIDE 12.5 MG PO CAPS: 12.5000 mg | ORAL_CAPSULE | Freq: Every day | ORAL | 3 refills | Status: DC

## 2016-09-10 NOTE — Assessment & Plan Note (Signed)
stable overall by history and exam, recent data reviewed with pt, and pt to continue medical treatment as before,  to f/u any worsening symptoms or concerns Lab Results  Component Value Date   LDLCALC 71 01/03/2016

## 2016-09-10 NOTE — Assessment & Plan Note (Signed)
stable overall by history and exam, recent data reviewed with pt, and pt to continue medical treatment as before,  to f/u any worsening symptoms or concerns BP Readings from Last 3 Encounters:  09/10/16 140/80  05/26/16 120/70  05/01/16 134/74

## 2016-09-10 NOTE — Patient Instructions (Signed)
Please take all new medication as prescribed - the mild fluid pill  Please continue all other medications as before, and refills have been done if requested.  Please have the pharmacy call with any other refills you may need.  Please continue your efforts at being more active, low cholesterol diet, and weight control..  Please keep your appointments with your specialists as you may have planned  Please go to the LAB in the Basement (turn left off the elevator) for the tests to be done today  You will be contacted by phone if any changes need to be made immediately.  Otherwise, you will receive a letter about your results with an explanation, but please check with MyChart first.  Please remember to sign up for MyChart if you have not done so, as this will be important to you in the future with finding out test results, communicating by private email, and scheduling acute appointments online when needed.  Please return in 6 months, or sooner if needed

## 2016-09-10 NOTE — Assessment & Plan Note (Signed)
stable overall by history and exam, recent data reviewed with pt, and pt to continue medical treatment as before,  to f/u any worsening symptoms or concerns Lab Results  Component Value Date   HGBA1C 4.9 01/03/2016

## 2016-09-10 NOTE — Assessment & Plan Note (Signed)
Mild, ok to add hct 12.5 mg per day,  to f/u any worsening symptoms or concerns

## 2016-09-10 NOTE — Progress Notes (Signed)
Pre visit review using our clinic review tool, if applicable. No additional management support is needed unless otherwise documented below in the visit note. 

## 2016-09-10 NOTE — Progress Notes (Signed)
Subjective:    Patient ID: Connor Morgan, male    DOB: [birth year], 80 y.o.   MRN: [id number]  HPI  Here to f/u; overall doing ok,  Pt denies chest pain, increasing sob or doe, wheezing, orthopnea, PND, palpitations, dizziness or syncope, but has had mild worsening bilat LE edema daily and persistent even at night, though much less in the am after leg elevation. .  Pt denies new neurological symptoms such as new headache, or facial or extremity weakness or numbness.  Pt denies polydipsia, polyuria, or low sugar episode.   Pt denies new neurological symptoms such as new headache, or facial or extremity weakness or numbness.   Pt states overall good compliance with meds, mostly trying to follow appropriate diet, with wt overall stable. BP Readings from Last 3 Encounters:  09/10/16 140/80  05/26/16 120/70  05/01/16 134/74  Has easy bruising to arms, worse to play with the dog.  Denies urinary symptoms such as dysuria, frequency, urgency, flank pain, hematuria or n/v, fever, chills.  Past Medical History:  Diagnosis Date  . ALLERGIC RHINITIS 10/19/2007  . ALLERGY 08/13/2007  . BENIGN PROSTATIC HYPERTROPHY 10/19/2007  . CHEST PAIN, ATYPICAL 12/03/2008  . DYSPNEA 11/20/2008  . ERECTILE DYSFUNCTION 10/19/2007  . FATIGUE 10/19/2007  . FREQUENCY, URINARY 11/20/2008  . Gastric ulcer   . GERD 10/19/2007  . GLUCOSE INTOLERANCE 10/19/2007  . HEMORRHOIDS, HX OF 08/13/2007  . HYPERLIPIDEMIA 10/19/2007  . HYPERTENSION 08/13/2007  . Impaired glucose tolerance 07/08/2011  . NEPHROLITHIASIS 08/13/2007  . NEPHROLITHIASIS, HX OF 10/19/2007  . ORCHIECTOMY, HX OF 10/19/2007  . Spermatocele 08/13/2007   Past Surgical History:  Procedure Laterality Date  . INGUINAL HERNIA REPAIR Bilateral   . s/p right orchiectomy     benign mass, right  . SKIN LESION EXCISION     neck  . TONSILLECTOMY      reports that he has quit smoking. He has never used smokeless tobacco. He reports that he does not drink alcohol or use  drugs. family history includes Dementia in his father and mother; Heart disease in his brother and father; Kidney disease in his mother. Allergies  Allergen Reactions  . Mevacor [Lovastatin]     REACTION: myalgias   Current Outpatient Prescriptions on File Prior to Visit  Medication Sig Dispense Refill  . amLODipine (NORVASC) 5 MG tablet Take 1 tablet (5 mg total) by mouth daily. 90 tablet 3  . aspirin 81 MG tablet Take 81 mg by mouth daily.      Marland Kitchen atorvastatin (LIPITOR) 10 MG tablet Take 1 tablet (10 mg total) by mouth daily. 90 tablet 3  . doxycycline (MONODOX) 100 MG capsule Take 1 capsule (100 mg total) by mouth 2 (two) times daily. 42 capsule 0  . pantoprazole (PROTONIX) 40 MG tablet Take 1 tablet (40 mg total) by mouth daily. 90 tablet 3  . triamcinolone cream (KENALOG) 0.1 % Apply 1 application topically 2 (two) times daily. 30 g 0   No current facility-administered medications on file prior to visit.    Review of Systems  Constitutional: Negative for unusual diaphoresis or night sweats HENT: Negative for ear swelling or discharge Eyes: Negative for worsening visual haziness  Respiratory: Negative for choking and stridor.   Gastrointestinal: Negative for distension or worsening eructation Genitourinary: Negative for retention or change in urine volume.  Musculoskeletal: Negative for other MSK pain or swelling Skin: Negative for color change and worsening wound Neurological: Negative for tremors and numbness other than noted  Psychiatric/Behavioral: Negative for decreased concentration or agitation other than above       Objective:   Physical Exam BP 140/80   Pulse (!) 52   Temp 98.1 F (36.7 C) (Oral)   Resp 20   Wt 176 lb (79.8 kg)   SpO2 98%   BMI 25.80 kg/m  VS noted,  Constitutional: Pt appears in no apparent distress HENT: Head: NCAT.  Right Ear: External ear normal.  Left Ear: External ear normal.  Eyes: . Pupils are equal, round, and reactive to light.  Conjunctivae and EOM are normal Neck: Normal range of motion. Neck supple.  Cardiovascular: Normal rate and regular rhythm.   Pulmonary/Chest: Effort normal and breath sounds without rales or wheezing.  Abd:  Soft, NT, ND, + BS Neurological: Pt is alert. Not confused , motor grossly intact Skin: Skin is warm. No rash, trace to 1+ bilat LE edema Psychiatric: Pt behavior is normal. No agitation.   Study Conclusions - Echo Mar 12 2016 - Left ventricle: The cavity size was normal. Systolic function was   normal. The estimated ejection fraction was in the range of 60%   to 65%. Wall motion was normal; there were no regional wall   motion abnormalities. Features are consistent with a pseudonormal   left ventricular filling pattern, with concomitant abnormal   relaxation and increased filling pressure (grade 2 diastolic   dysfunction). - Aortic valve: Trileaflet; normal thickness, mildly calcified   leaflets.    Assessment & Plan:

## 2016-09-10 NOTE — Assessment & Plan Note (Signed)
Asympt, but psa appears to be increasing in the past 1-2 yrs, for f/u psa today

## 2016-09-23 ENCOUNTER — Other Ambulatory Visit: Payer: Self-pay | Admitting: *Deleted

## 2016-09-23 MED ORDER — AMLODIPINE BESYLATE 5 MG PO TABS: 5.0000 mg | ORAL_TABLET | Freq: Every day | ORAL | 3 refills | Status: DC

## 2016-09-23 MED ORDER — ATORVASTATIN CALCIUM 10 MG PO TABS: 10.0000 mg | ORAL_TABLET | Freq: Every day | ORAL | 3 refills | Status: DC

## 2016-12-29 DIAGNOSIS — H524 Presbyopia: Secondary | ICD-10-CM | POA: Diagnosis not present

## 2016-12-29 DIAGNOSIS — H2513 Age-related nuclear cataract, bilateral: Secondary | ICD-10-CM | POA: Diagnosis not present

## 2017-02-11 ENCOUNTER — Telehealth: Payer: Self-pay | Admitting: *Deleted

## 2017-02-11 MED ORDER — AMLODIPINE BESYLATE 5 MG PO TABS: 5.0000 mg | ORAL_TABLET | Freq: Every day | ORAL | 2 refills | Status: DC

## 2017-02-11 NOTE — Telephone Encounter (Signed)
Wife called left msg on triage pt needing refill on his norvasc. Called wife back verified pharmacy sent script to [place]...Johny Chess

## 2017-03-11 ENCOUNTER — Ambulatory Visit (INDEPENDENT_AMBULATORY_CARE_PROVIDER_SITE_OTHER): Payer: Medicare Other | Admitting: Internal Medicine

## 2017-03-11 ENCOUNTER — Encounter: Payer: Self-pay | Admitting: Internal Medicine

## 2017-03-11 VITALS — BP 124/78 | HR 75 | Temp 98.2°F | Ht 69.0 in | Wt 176.0 lb

## 2017-03-11 DIAGNOSIS — Z0000 Encounter for general adult medical examination without abnormal findings: Secondary | ICD-10-CM | POA: Diagnosis not present

## 2017-03-11 DIAGNOSIS — R7302 Impaired glucose tolerance (oral): Secondary | ICD-10-CM

## 2017-03-11 DIAGNOSIS — J111 Influenza due to unidentified influenza virus with other respiratory manifestations: Secondary | ICD-10-CM

## 2017-03-11 DIAGNOSIS — I10 Essential (primary) hypertension: Secondary | ICD-10-CM | POA: Diagnosis not present

## 2017-03-11 DIAGNOSIS — R69 Illness, unspecified: Secondary | ICD-10-CM

## 2017-03-11 MED ORDER — ATORVASTATIN CALCIUM 10 MG PO TABS: 10.0000 mg | ORAL_TABLET | Freq: Every day | ORAL | 3 refills | Status: DC

## 2017-03-11 MED ORDER — OSELTAMIVIR PHOSPHATE 75 MG PO CAPS: 75.0000 mg | ORAL_CAPSULE | Freq: Two times a day (BID) | ORAL | 0 refills | Status: DC

## 2017-03-11 NOTE — Patient Instructions (Addendum)
Please take all new medication as prescribed - the tamiflu  Hold on taking the hydrochlorothiazide until after the diarrhea goes away.    Please continue all other medications as before, and refills have been done if requested.  Please have the pharmacy call with any other refills you may need.  Please keep your appointments with your specialists as you may have planned  Please return in 6 months, or sooner if needed  Continue to eat heart healthy diet (full of fruits, vegetables, whole grains, lean protein, water--limit salt, fat, and sugar intake) and increase physical activity as tolerated.  Continue doing brain stimulating activities (puzzles, reading, adult coloring books, staying active) to keep memory sharp.    Connor Morgan , Thank you for taking time to come for your Medicare Wellness Visit. I appreciate your ongoing commitment to your health goals. Please review the following plan we discussed and let me know if I can assist you in the future.   These are the goals we discussed: Goals    . Maintain current health status          Continue to be healthy, eat healthy, and enjoy life.       This is a list of the screening recommended for you and due dates:  Health Maintenance  Topic Date Due  . Flu Shot  07/07/2017  . Tetanus Vaccine  05/26/2026  . Pneumonia vaccines  Completed

## 2017-03-11 NOTE — Progress Notes (Signed)
Pre visit review using our clinic review tool, if applicable. No additional management support is needed unless otherwise documented below in the visit note. 

## 2017-03-11 NOTE — Progress Notes (Signed)
Subjective:   Connor Morgan is a 81 y.o. male who presents for an Initial Medicare Annual Wellness Visit.  Review of Systems  No ROS.  Medicare Wellness Visit.  Cardiac Risk Factors include: advanced age (>36men, >33 women);dyslipidemia;hypertension;male gender;family history of premature cardiovascular disease Sleep patterns: feels rested on waking, gets up 2-3 times nightly to void and sleeps 8-9 hours nightly.   Home Safety/Smoke Alarms: Feels safe in home. Smoke alarms in place.     Living environment; residence and Firearm Safety: firearms stored safely. Lives with wife, family and neighbor support, no DME needs. Seat Belt Safety/Bike Helmet: Wears seat belt.   Counseling:   Eye Exam- Last 3/18 Dental- dentures, mouth hygiene discussed  Male:   CCS- Last 11/106, normal    PSA-  Lab Results  Component Value Date   PSA 1.64 09/10/2016   PSA 2.21 01/03/2016   PSA 1.73 12/22/2013       Objective:    Today's Vitals   03/11/17 1049  BP: 124/78  Pulse: 75  Temp: 98.2 F (36.8 C)  TempSrc: Oral  SpO2: 98%  Weight: 176 lb (79.8 kg)  Height: 5\' 9"  (1.753 m)   Body mass index is 25.99 kg/m.  Current Medications (verified) Outpatient Encounter Prescriptions as of 03/11/2017  Medication Sig  . amLODipine (NORVASC) 5 MG tablet Take 1 tablet (5 mg total) by mouth daily.  Marland Kitchen aspirin 81 MG tablet Take 81 mg by mouth daily.    Marland Kitchen atorvastatin (LIPITOR) 10 MG tablet Take 1 tablet (10 mg total) by mouth daily.  . hydrochlorothiazide (MICROZIDE) 12.5 MG capsule Take 1 capsule (12.5 mg total) by mouth daily.  Marland Kitchen oseltamivir (TAMIFLU) 75 MG capsule Take 1 capsule (75 mg total) by mouth 2 (two) times daily.  . pantoprazole (PROTONIX) 40 MG tablet Take 1 tablet (40 mg total) by mouth daily.  Marland Kitchen triamcinolone cream (KENALOG) 0.1 % Apply 1 application topically 2 (two) times daily.  . [DISCONTINUED] atorvastatin (LIPITOR) 10 MG tablet Take 1 tablet (10 mg total) by mouth daily.  .  [DISCONTINUED] doxycycline (MONODOX) 100 MG capsule Take 1 capsule (100 mg total) by mouth 2 (two) times daily. (Patient not taking: Reported on 03/11/2017)   No facility-administered encounter medications on file as of 03/11/2017.     Allergies (verified) Mevacor [lovastatin]   History: Past Medical History:  Diagnosis Date  . ALLERGIC RHINITIS 10/19/2007  . ALLERGY 08/13/2007  . BENIGN PROSTATIC HYPERTROPHY 10/19/2007  . CHEST PAIN, ATYPICAL 12/03/2008  . DYSPNEA 11/20/2008  . ERECTILE DYSFUNCTION 10/19/2007  . FATIGUE 10/19/2007  . FREQUENCY, URINARY 11/20/2008  . Gastric ulcer   . GERD 10/19/2007  . GLUCOSE INTOLERANCE 10/19/2007  . HEMORRHOIDS, HX OF 08/13/2007  . HYPERLIPIDEMIA 10/19/2007  . HYPERTENSION 08/13/2007  . Impaired glucose tolerance 07/08/2011  . NEPHROLITHIASIS 08/13/2007  . NEPHROLITHIASIS, HX OF 10/19/2007  . ORCHIECTOMY, HX OF 10/19/2007  . Spermatocele 08/13/2007   Past Surgical History:  Procedure Laterality Date  . INGUINAL HERNIA REPAIR Bilateral   . s/p right orchiectomy     benign mass, right  . SKIN LESION EXCISION     neck  . TONSILLECTOMY     Family History  Problem Relation Age of Onset  . Dementia Mother   . Kidney disease Mother   . Heart disease Father   . Dementia Father   . Heart disease Brother     x 2   Social History   Occupational History  . previously worked in  Real Estate and Cabin crew    Social History Main Topics  . Smoking status: Former Research scientist (life sciences)  . Smokeless tobacco: Never Used  . Alcohol use No  . Drug use: No  . Sexual activity: Not on file   Tobacco Counseling Counseling given: Not Answered   Activities of Daily Living In your present state of health, do you have any difficulty performing the following activities: 03/11/2017  Hearing? Y  Vision? N  Difficulty concentrating or making decisions? N  Walking or climbing stairs? N  Dressing or bathing? N  Doing errands, shopping? N  Preparing Food and eating ? N    Using the Toilet? N  In the past six months, have you accidently leaked urine? N  Do you have problems with loss of bowel control? N  Managing your Medications? N  Managing your Finances? N  Housekeeping or managing your Housekeeping? N  Some recent data might be hidden    Immunizations and Health Maintenance Immunization History  Administered Date(s) Administered  . Influenza Whole 10/19/2007, 10/09/2008  . Influenza,inj,Quad PF,36+ Mos 12/22/2013  . Pneumococcal Conjugate-13 01/05/2014  . Pneumococcal Polysaccharide-23 10/19/2007  . Td 12/07/1994, 11/20/2008  . Tdap 05/26/2016   There are no preventive care reminders to display for this patient.  Patient Care Team: Biagio Borg, MD as PCP - General  Indicate any recent Medical Services you may have received from other than [facility] providers in the past year (date may be approximate).    Assessment:   This is a routine wellness examination for Connor Morgan. Physical assessment deferred to PCP.   Hearing/Vision screen Hearing Screening Comments: Right ear HOH, patient declines audiology referral Vision Screening Comments: Wears glasses  Dietary issues and exercise activities discussed: Current Exercise Habits: Home exercise routine, Type of exercise: walking, Time (Minutes): 35, Frequency (Times/Week): 7, Weekly Exercise (Minutes/Week): 245, Intensity: Mild  Diet (meal preparation, eat out, water intake, caffeinated beverages, dairy products, fruits and vegetables): in general, a "healthy" diet  , low fat/ cholesterol, low salt Patient reports eating a variety of fruits and vegetables daily, limits caffeine and sugar drinks, drinks 7-8 glasses of water per day.   Goals    . Maintain current health status          Continue to be healthy, eat healthy, and enjoy life.      Depression Screen PHQ 2/9 Scores 03/11/2017 03/11/2017 01/03/2016 03/14/2014  PHQ - 2 Score 0 0 0 0    Fall Risk Fall Risk  03/11/2017 03/11/2017 01/03/2016 03/14/2014   Falls in the past year? No No No No    Cognitive Function:       Ad8 score reviewed for issues:  Issues making decisions: no  Less interest in hobbies / activities: no  Repeats questions, stories (family complaining): no  Trouble using ordinary gadgets (microwave, computer, phone): no  Forgets the month or year: no  Mismanaging finances: no  Remembering appts: no  Daily problems with thinking and/or memory: no Ad8 score is= 0  Screening Tests Health Maintenance  Topic Date Due  . INFLUENZA VACCINE  07/07/2017  . TETANUS/TDAP  05/26/2026  . PNA vac Low Risk Adult  Completed      Plan:     Continue to eat heart healthy diet (full of fruits, vegetables, whole grains, lean protein, water--limit salt, fat, and sugar intake) and increase physical activity as tolerated.  Continue doing brain stimulating activities (puzzles, reading, adult coloring books, staying active) to keep memory sharp.  During the course of the visit Sollie was educated and counseled about the following appropriate screening and preventive services:   Vaccines to include Pneumoccal, Influenza, Hepatitis B, Td, Zostavax, HCV  Colorectal cancer screening  Cardiovascular disease screening  Diabetes screening  Glaucoma screening  Nutrition counseling  Prostate cancer screening  Patient Instructions (the written plan) were given to the patient.   Michiel Cowboy, RN   03/11/2017   Medical screening examination/treatment/procedure(s) were performed by non-physician practitioner and as supervising physician I was immediately available for consultation/collaboration. I agree with above. Michiel Cowboy, RN

## 2017-03-11 NOTE — Progress Notes (Signed)
Subjective:    Patient ID: Connor Morgan, male    DOB: 1935/12/14, 81 y.o.   MRN: [id number]  HPI  Here with general weakness and malise, head congestion, ST, non prod cough, general aches and fever with nausea for 3 days.  Wife with + flu proven yesterday, now is starting with achiness all over and diarrhea just starting this am.  Pt denies chest pain, increased sob or doe, wheezing, orthopnea, PND, increased LE swelling, palpitations, dizziness or syncope. Past Medical History:  Diagnosis Date  . ALLERGIC RHINITIS 10/19/2007  . ALLERGY 08/13/2007  . BENIGN PROSTATIC HYPERTROPHY 10/19/2007  . CHEST PAIN, ATYPICAL 12/03/2008  . DYSPNEA 11/20/2008  . ERECTILE DYSFUNCTION 10/19/2007  . FATIGUE 10/19/2007  . FREQUENCY, URINARY 11/20/2008  . Gastric ulcer   . GERD 10/19/2007  . GLUCOSE INTOLERANCE 10/19/2007  . HEMORRHOIDS, HX OF 08/13/2007  . HYPERLIPIDEMIA 10/19/2007  . HYPERTENSION 08/13/2007  . Impaired glucose tolerance 07/08/2011  . NEPHROLITHIASIS 08/13/2007  . NEPHROLITHIASIS, HX OF 10/19/2007  . ORCHIECTOMY, HX OF 10/19/2007  . Spermatocele 08/13/2007   Past Surgical History:  Procedure Laterality Date  . INGUINAL HERNIA REPAIR Bilateral   . s/p right orchiectomy     benign mass, right  . SKIN LESION EXCISION     neck  . TONSILLECTOMY      reports that he has quit smoking. He has never used smokeless tobacco. He reports that he does not drink alcohol or use drugs. family history includes Dementia in his father and mother; Heart disease in his brother and father; Kidney disease in his mother. Allergies  Allergen Reactions  . Mevacor [Lovastatin]     REACTION: myalgias   Current Outpatient Prescriptions on File Prior to Visit  Medication Sig Dispense Refill  . amLODipine (NORVASC) 5 MG tablet Take 1 tablet (5 mg total) by mouth daily. 90 tablet 2  . aspirin 81 MG tablet Take 81 mg by mouth daily.      . hydrochlorothiazide (MICROZIDE) 12.5 MG capsule Take 1 capsule (12.5 mg  total) by mouth daily. 90 capsule 3  . pantoprazole (PROTONIX) 40 MG tablet Take 1 tablet (40 mg total) by mouth daily. 90 tablet 3  . triamcinolone cream (KENALOG) 0.1 % Apply 1 application topically 2 (two) times daily. 30 g 0   No current facility-administered medications on file prior to visit.    Review of Systems  Constitutional: Negative for other unusual diaphoresis or sweats HENT: Negative for ear discharge or swelling Eyes: Negative for other worsening visual disturbances Respiratory: Negative for stridor or other swelling  Gastrointestinal: Negative for worsening distension or other blood Genitourinary: Negative for retention or other urinary change Musculoskeletal: Negative for other MSK pain or swelling Skin: Negative for color change or other new lesions Neurological: Negative for worsening tremors and other numbness  Psychiatric/Behavioral: Negative for worsening agitation or other fatigue All other system neg per pt    Objective:   Physical Exam BP 124/78   Pulse 75   Temp 98.2 F (36.8 C) (Oral)   Ht 5\' 9"  (1.753 m)   Wt 176 lb (79.8 kg)   SpO2 98%   BMI 25.99 kg/m   VS noted, mild ill Constitutional: Pt appears in NAD HENT: Head: NCAT.  Right Ear: External ear normal.  Left Ear: External ear normal.  Eyes: . Pupils are equal, round, and reactive to light. Conjunctivae and EOM are normal Nose: without d/c or deformity Bilat tm's with mild erythema.  Max sinus areas  non tender.  Pharynx with mild erythema, no exudate Neck: Neck supple. Gross normal ROM Cardiovascular: Normal rate and regular rhythm.   Pulmonary/Chest: Effort normal and breath sounds without rales or wheezing.  Abd:  Soft, NT, ND, + BS, no organomegaly Neurological: Pt is alert. At baseline orientation, motor grossly intact Skin: Skin is warm. No rashes, other new lesions, no LE edema Psychiatric: Pt behavior is normal without agitation  No other exam findings    Assessment & Plan:

## 2017-03-14 NOTE — Assessment & Plan Note (Signed)
stable overall by history and exam, recent data reviewed with pt, and pt to continue medical treatment as before,  to f/u any worsening symptoms or concerns Lab Results  Component Value Date   HGBA1C 5.0 09/10/2016   Pt to call for onset polys with illness

## 2017-03-14 NOTE — Assessment & Plan Note (Signed)
Mild to mod, for tamiflu asd,  to f/u any worsening symptoms or concerns 

## 2017-03-14 NOTE — Assessment & Plan Note (Signed)
Ok to hold hct during diarrhea.  to f/u any worsening symptoms or concerns

## 2017-09-21 ENCOUNTER — Encounter: Payer: Self-pay | Admitting: Internal Medicine

## 2017-09-21 ENCOUNTER — Ambulatory Visit (INDEPENDENT_AMBULATORY_CARE_PROVIDER_SITE_OTHER)
Admission: RE | Admit: 2017-09-21 | Discharge: 2017-09-21 | Disposition: A | Discharge status: Home / Self Care | Payer: Medicare Other | Source: Ambulatory Visit | Attending: Internal Medicine | Admitting: Internal Medicine

## 2017-09-21 ENCOUNTER — Ambulatory Visit (INDEPENDENT_AMBULATORY_CARE_PROVIDER_SITE_OTHER): Payer: Medicare Other | Admitting: Internal Medicine

## 2017-09-21 ENCOUNTER — Other Ambulatory Visit (INDEPENDENT_AMBULATORY_CARE_PROVIDER_SITE_OTHER): Payer: Medicare Other

## 2017-09-21 VITALS — BP 126/84 | HR 84 | Temp 97.9°F | Ht 69.0 in | Wt 177.0 lb

## 2017-09-21 DIAGNOSIS — R7302 Impaired glucose tolerance (oral): Secondary | ICD-10-CM

## 2017-09-21 DIAGNOSIS — R1310 Dysphagia, unspecified: Secondary | ICD-10-CM

## 2017-09-21 DIAGNOSIS — R221 Localized swelling, mass and lump, neck: Secondary | ICD-10-CM

## 2017-09-21 DIAGNOSIS — N320 Bladder-neck obstruction: Secondary | ICD-10-CM

## 2017-09-21 DIAGNOSIS — Z23 Encounter for immunization: Secondary | ICD-10-CM | POA: Diagnosis not present

## 2017-09-21 DIAGNOSIS — I700 Atherosclerosis of aorta: Secondary | ICD-10-CM | POA: Diagnosis not present

## 2017-09-21 DIAGNOSIS — I10 Essential (primary) hypertension: Secondary | ICD-10-CM | POA: Diagnosis not present

## 2017-09-21 LAB — CBC WITH DIFFERENTIAL/PLATELET
BASOS ABS: 0.1 10*3/uL (ref 0.0–0.1)
Basophils Relative: 1.2 % (ref 0.0–3.0)
EOS PCT: 3.4 % (ref 0.0–5.0)
Eosinophils Absolute: 0.2 10*3/uL (ref 0.0–0.7)
HEMATOCRIT: 43.3 % (ref 39.0–52.0)
Hemoglobin: 15.1 g/dL (ref 13.0–17.0)
LYMPHS PCT: 24.3 % (ref 12.0–46.0)
Lymphs Abs: 1.7 10*3/uL (ref 0.7–4.0)
MCHC: 34.9 g/dL (ref 30.0–36.0)
MCV: 91.3 fl (ref 78.0–100.0)
MONOS PCT: 9.1 % (ref 3.0–12.0)
Monocytes Absolute: 0.6 10*3/uL (ref 0.1–1.0)
NEUTROS ABS: 4.4 10*3/uL (ref 1.4–7.7)
Neutrophils Relative %: 62 % (ref 43.0–77.0)
PLATELETS: 207 10*3/uL (ref 150.0–400.0)
RBC: 4.74 Mil/uL (ref 4.22–5.81)
RDW: 13 % (ref 11.5–15.5)
WBC: 7.1 10*3/uL (ref 4.0–10.5)

## 2017-09-21 LAB — URINALYSIS, ROUTINE W REFLEX MICROSCOPIC
BILIRUBIN URINE: NEGATIVE
HGB URINE DIPSTICK: NEGATIVE
KETONES UR: NEGATIVE
LEUKOCYTES UA: NEGATIVE
Nitrite: NEGATIVE
PH: 7 (ref 5.0–8.0)
RBC / HPF: NONE SEEN (ref 0–?)
Specific Gravity, Urine: <=1.005 — AB (ref 1.000–1.030)
TOTAL PROTEIN, URINE-UPE24: NEGATIVE
UROBILINOGEN UA: 0.2 (ref 0.0–1.0)
Urine Glucose: NEGATIVE
WBC, UA: NONE SEEN (ref 0–?)

## 2017-09-21 LAB — HEPATIC FUNCTION PANEL
ALT: 16 U/L (ref 0–53)
AST: 17 U/L (ref 0–37)
Albumin: 4.5 g/dL (ref 3.5–5.2)
Alkaline Phosphatase: 128 U/L — ABNORMAL HIGH (ref 39–117)
BILIRUBIN DIRECT: 0.2 mg/dL (ref 0.0–0.3)
TOTAL PROTEIN: 6.9 g/dL (ref 6.0–8.3)
Total Bilirubin: 0.7 mg/dL (ref 0.2–1.2)

## 2017-09-21 LAB — LIPID PANEL
CHOL/HDL RATIO: 3
Cholesterol: 147 mg/dL (ref 0–200)
HDL: 54.6 mg/dL (ref >39.00)
LDL CALC: 77 mg/dL (ref 0–99)
NONHDL: 91.9
Triglycerides: 73 mg/dL (ref 0.0–149.0)
VLDL: 14.6 mg/dL (ref 0.0–40.0)

## 2017-09-21 LAB — HEMOGLOBIN A1C: HEMOGLOBIN A1C: 5.2 % (ref 4.6–6.5)

## 2017-09-21 LAB — TSH: TSH: 1.99 u[IU]/mL (ref 0.35–4.50)

## 2017-09-21 LAB — BASIC METABOLIC PANEL
BUN: 17 mg/dL (ref 6–23)
CALCIUM: 9.8 mg/dL (ref 8.4–10.5)
CO2: 30 meq/L (ref 19–32)
CREATININE: 0.88 mg/dL (ref 0.40–1.50)
Chloride: 103 mEq/L (ref 96–112)
GFR: 88.66 mL/min (ref >60.00)
Glucose, Bld: 95 mg/dL (ref 70–99)
Potassium: 4.4 mEq/L (ref 3.5–5.1)
Sodium: 143 mEq/L (ref 135–145)

## 2017-09-21 LAB — PSA: PSA: 1.96 ng/mL (ref 0.10–4.00)

## 2017-09-21 MED ORDER — AMLODIPINE BESYLATE 5 MG PO TABS: 5.0000 mg | ORAL_TABLET | Freq: Every day | ORAL | 2 refills | Status: DC

## 2017-09-21 MED ORDER — ATORVASTATIN CALCIUM 10 MG PO TABS: 10.0000 mg | ORAL_TABLET | Freq: Every day | ORAL | 3 refills | Status: DC

## 2017-09-21 NOTE — Assessment & Plan Note (Signed)
Unclear etiology, suspect should f/u with GI but pt declines for now,  to f/u any worsening symptoms or concerns

## 2017-09-21 NOTE — Progress Notes (Signed)
Subjective:    Patient ID: Connor Morgan, male    DOB: 1936/08/26, 81 y.o.   MRN: [id number]  HPI  Here to f/u with c/o right neck fullness swelling and sensitivity, also c/o C/o recurrent now worsening dysphagia seems to be in the neck area, has to hold head back to even take pills in the AM.  No wt loss, no trauma, fever.  Pt denies chest pain, increased sob or doe, wheezing, orthopnea, PND, increased LE swelling, palpitations, dizziness or syncope.   Pt denies polydipsia, polyuria Past Medical History:  Diagnosis Date  . ALLERGIC RHINITIS 10/19/2007  . ALLERGY 08/13/2007  . BENIGN PROSTATIC HYPERTROPHY 10/19/2007  . CHEST PAIN, ATYPICAL 12/03/2008  . DYSPNEA 11/20/2008  . ERECTILE DYSFUNCTION 10/19/2007  . FATIGUE 10/19/2007  . FREQUENCY, URINARY 11/20/2008  . Gastric ulcer   . GERD 10/19/2007  . GLUCOSE INTOLERANCE 10/19/2007  . HEMORRHOIDS, HX OF 08/13/2007  . HYPERLIPIDEMIA 10/19/2007  . HYPERTENSION 08/13/2007  . Impaired glucose tolerance 07/08/2011  . NEPHROLITHIASIS 08/13/2007  . NEPHROLITHIASIS, HX OF 10/19/2007  . ORCHIECTOMY, HX OF 10/19/2007  . Spermatocele 08/13/2007   Past Surgical History:  Procedure Laterality Date  . INGUINAL HERNIA REPAIR Bilateral   . s/p right orchiectomy     benign mass, right  . SKIN LESION EXCISION     neck  . TONSILLECTOMY      reports that he has quit smoking. He has never used smokeless tobacco. He reports that he does not drink alcohol or use drugs. family history includes Dementia in his father and mother; Heart disease in his brother and father; Kidney disease in his mother. Allergies  Allergen Reactions  . Mevacor [Lovastatin]     REACTION: myalgias   Current Outpatient Prescriptions on File Prior to Visit  Medication Sig Dispense Refill  . aspirin 81 MG tablet Take 81 mg by mouth daily.      . pantoprazole (PROTONIX) 40 MG tablet Take 1 tablet (40 mg total) by mouth daily. 90 tablet 3  . triamcinolone cream (KENALOG) 0.1 % Apply  1 application topically 2 (two) times daily. 30 g 0  . hydrochlorothiazide (MICROZIDE) 12.5 MG capsule Take 1 capsule (12.5 mg total) by mouth daily. 90 capsule 3   No current facility-administered medications on file prior to visit.    Review of Systems  Constitutional: Negative for other unusual diaphoresis or sweats HENT: Negative for ear discharge or swelling Eyes: Negative for other worsening visual disturbances Respiratory: Negative for stridor or other swelling  Gastrointestinal: Negative for worsening distension or other blood Genitourinary: Negative for retention or other urinary change Musculoskeletal: Negative for other MSK pain or swelling Skin: Negative for color change or other new lesions Neurological: Negative for worsening tremors and other numbness  Psychiatric/Behavioral: Negative for worsening agitation or other fatigue All other system neg per pt    Objective:   Physical Exam BP 126/84   Pulse 84   Temp 97.9 F (36.6 C) (Oral)   Ht 5\' 9"  (1.753 m)   Wt 177 lb (80.3 kg)   SpO2 98%   BMI 26.14 kg/m  VS noted, not ill appearing Constitutional: Pt appears in NAD HENT: Head: NCAT.  Right Ear: External ear normal.  Left Ear: External ear normal.  Eyes: . Pupils are equal, round, and reactive to light. Conjunctivae and EOM are normal Nose: without d/c or deformity Neck: Neck supple. Gross normal ROM, does have bony and some soft tissue swelling and firmness near the right  sternoclavicular joint and right neck without specific palpable mass Cardiovascular: Normal rate and regular rhythm.   Pulmonary/Chest: Effort normal and breath sounds without rales or wheezing.  Abd:  Soft, NT, ND, + BS, no organomegaly Neurological: Pt is alert. At baseline orientation, motor grossly intact Skin: Skin is warm. No rashes, other new lesions, no LE edema Psychiatric: Pt behavior is normal without agitation , 1+ nervous No other exam findings Lab Results  Component Value Date    WBC 7.1 09/21/2017   HGB 15.1 09/21/2017   HCT 43.3 09/21/2017   PLT 207.0 09/21/2017   GLUCOSE 95 09/21/2017   CHOL 147 09/21/2017   TRIG 73.0 09/21/2017   HDL 54.60 09/21/2017   LDLDIRECT 128.8 10/19/2007   LDLCALC 77 09/21/2017   ALT 16 09/21/2017   AST 17 09/21/2017   NA 143 09/21/2017   K 4.4 09/21/2017   CL 103 09/21/2017   CREATININE 0.88 09/21/2017   BUN 17 09/21/2017   CO2 30 09/21/2017   TSH 1.99 09/21/2017   PSA 1.96 09/21/2017   HGBA1C 5.2 09/21/2017       Assessment & Plan:

## 2017-09-21 NOTE — Assessment & Plan Note (Signed)
stable overall by history and exam, recent data reviewed with pt, and pt to continue medical treatment as before,  to f/u any worsening symptoms or concerns Lab Results  Component Value Date   HGBA1C 5.2 09/21/2017

## 2017-09-21 NOTE — Assessment & Plan Note (Signed)
stable overall by history and exam, recent data reviewed with pt, and pt to continue medical treatment as before,  to f/u any worsening symptoms or concerns BP Readings from Last 3 Encounters:  09/21/17 126/84  03/11/17 124/78  09/10/16 140/80

## 2017-09-21 NOTE — Patient Instructions (Addendum)
Please continue all other medications as before, and refills have been done if requested.  Please have the pharmacy call with any other refills you may need.  Please continue your efforts at being more active, low cholesterol diet, and weight control.  You are otherwise up to date with prevention measures today.  Please keep your appointments with your specialists as you may have planned  You will be contacted regarding the referral for: CT scan of neck, and Gastroenterology (Dr Loletha Carrow)  Please go to the XRAY Department in the Basement (go straight as you get off the elevator) for the x-ray testing  Please go to the LAB in the Basement (turn left off the elevator) for the tests to be done today  You will be contacted by phone if any changes need to be made immediately.  Otherwise, you will receive a letter about your results with an explanation, but please check with MyChart first.  Please remember to sign up for MyChart if you have not done so, as this will be important to you in the future with finding out test results, communicating by private email, and scheduling acute appointments online when needed.  Please return in 6 months, or sooner if needed

## 2017-09-21 NOTE — Assessment & Plan Note (Signed)
Unclear, for CT neck r/o mass or other abnormality

## 2017-09-24 ENCOUNTER — Encounter: Payer: Self-pay | Admitting: Internal Medicine

## 2017-09-24 ENCOUNTER — Ambulatory Visit (INDEPENDENT_AMBULATORY_CARE_PROVIDER_SITE_OTHER)
Admission: RE | Admit: 2017-09-24 | Discharge: 2017-09-24 | Disposition: A | Discharge status: Home / Self Care | Payer: Medicare Other | Source: Ambulatory Visit | Attending: Internal Medicine | Admitting: Internal Medicine

## 2017-09-24 DIAGNOSIS — R221 Localized swelling, mass and lump, neck: Secondary | ICD-10-CM | POA: Diagnosis not present

## 2017-09-24 MED ORDER — IOPAMIDOL (ISOVUE-300) INJECTION 61%
75.0000 mL | Freq: Once | INTRAVENOUS | Status: AC | PRN
  Administered 2017-09-24: 75 mL via INTRAVENOUS

## 2017-11-11 ENCOUNTER — Ambulatory Visit: Payer: Medicare Other | Admitting: Gastroenterology

## 2018-02-10 ENCOUNTER — Encounter: Payer: Self-pay | Admitting: Internal Medicine

## 2018-03-22 ENCOUNTER — Ambulatory Visit: Payer: Medicare Other | Admitting: Internal Medicine

## 2018-03-22 DIAGNOSIS — Z2089 Contact with and (suspected) exposure to other communicable diseases: Secondary | ICD-10-CM

## 2018-04-22 ENCOUNTER — Ambulatory Visit (INDEPENDENT_AMBULATORY_CARE_PROVIDER_SITE_OTHER): Payer: Medicare Other | Admitting: Family Medicine

## 2018-04-22 ENCOUNTER — Encounter: Payer: Self-pay | Admitting: Family Medicine

## 2018-04-22 VITALS — BP 124/60 | HR 74 | Temp 97.7°F | Ht 71.0 in | Wt 181.0 lb

## 2018-04-22 DIAGNOSIS — S30861A Insect bite (nonvenomous) of abdominal wall, initial encounter: Secondary | ICD-10-CM

## 2018-04-22 DIAGNOSIS — B356 Tinea cruris: Secondary | ICD-10-CM

## 2018-04-22 DIAGNOSIS — W57XXXA Bitten or stung by nonvenomous insect and other nonvenomous arthropods, initial encounter: Secondary | ICD-10-CM | POA: Diagnosis not present

## 2018-04-22 MED ORDER — CLOTRIMAZOLE-BETAMETHASONE 1-0.05 % EX CREA: 1.0000 "application " | TOPICAL_CREAM | Freq: Two times a day (BID) | CUTANEOUS | 0 refills | Status: DC

## 2018-04-22 NOTE — Progress Notes (Signed)
Subjective:  By signing my name below, I, Moises Blood, attest that this documentation has been prepared under the direction and in the presence of Merri Ray, MD. Electronically Signed: Moises Blood, [place]. 04/22/2018 , 3:12 PM .  Patient was seen in Room 12 .   Patient ID: Connor Morgan, male    DOB: August 02, 1936, 82 y.o.   MRN: [id number] Chief Complaint  Patient presents with  . Tick Removal    groin area and to the back. the red spot on the bite and red rash on tale bone. was bitten 10 days ago   HPI Connor Morgan is a 82 y.o. male  Patient noticed a tick bite in left inguinal fold about 10 days ago, with tick present for less than 24 hours. He tried to remove it himself, and used alcohol around it, then finally used tweezers to remove the tick. Then, 2 nights ago, he noticed a red streak around the area in the inguinal fold with itching into his his testicles, and another similar patch over his lower back. He denies other rash, headache, nausea, vomiting or fever. He's been applying triple antibiotic ointment over the affected areas.   He mentions he's been working outside recently.   Patient Active Problem List   Diagnosis Date Noted  . Mass of right side of neck 09/21/2017  . Influenza-like illness 03/11/2017  . Diastolic dysfunction with chronic heart failure ([place]) 09/10/2016  . Increased prostate specific antigen (PSA) velocity 09/10/2016  . Tick bite of left axillary region 05/26/2016  . ANA positive 03/12/2016  . Dysphagia 02/25/2016  . Rash 02/25/2016  . Abnormal x-ray 01/03/2016  . Impaired glucose tolerance 07/08/2011  . Preventative health care 07/08/2011  . Bladder neck obstruction 07/08/2011  . FREQUENCY, URINARY 11/20/2008  . Hyperlipidemia 10/19/2007  . ERECTILE DYSFUNCTION 10/19/2007  . ALLERGIC RHINITIS 10/19/2007  . GERD 10/19/2007  . BENIGN PROSTATIC HYPERTROPHY 10/19/2007  . FATIGUE 10/19/2007  . NEPHROLITHIASIS, HX OF 10/19/2007  .  ORCHIECTOMY, HX OF 10/19/2007  . Essential hypertension 08/13/2007  . INGUINAL HERNIAS, BILATERAL 08/13/2007  . Spermatocele 08/13/2007  . HEMORRHOIDS, HX OF 08/13/2007   Past Medical History:  Diagnosis Date  . ALLERGIC RHINITIS 10/19/2007  . ALLERGY 08/13/2007  . BENIGN PROSTATIC HYPERTROPHY 10/19/2007  . CHEST PAIN, ATYPICAL 12/03/2008  . DYSPNEA 11/20/2008  . ERECTILE DYSFUNCTION 10/19/2007  . FATIGUE 10/19/2007  . FREQUENCY, URINARY 11/20/2008  . Gastric ulcer   . GERD 10/19/2007  . GLUCOSE INTOLERANCE 10/19/2007  . HEMORRHOIDS, HX OF 08/13/2007  . HYPERLIPIDEMIA 10/19/2007  . HYPERTENSION 08/13/2007  . Impaired glucose tolerance 07/08/2011  . NEPHROLITHIASIS 08/13/2007  . NEPHROLITHIASIS, HX OF 10/19/2007  . ORCHIECTOMY, HX OF 10/19/2007  . Spermatocele 08/13/2007   Past Surgical History:  Procedure Laterality Date  . INGUINAL HERNIA REPAIR Bilateral   . s/p right orchiectomy     benign mass, right  . SKIN LESION EXCISION     neck  . TONSILLECTOMY     Allergies  Allergen Reactions  . Mevacor [Lovastatin]     REACTION: myalgias   Prior to Admission medications   Medication Sig Start Date End Date Taking? Authorizing Provider  amLODipine (NORVASC) 5 MG tablet Take 1 tablet (5 mg total) by mouth daily. 09/21/17   Biagio Borg, MD  aspirin 81 MG tablet Take 81 mg by mouth daily.      [provider]  atorvastatin (LIPITOR) 10 MG tablet Take 1 tablet (10 mg total) by mouth  daily. 09/21/17 09/21/18  Biagio Borg, MD  hydrochlorothiazide (MICROZIDE) 12.5 MG capsule Take 1 capsule (12.5 mg total) by mouth daily. 09/10/16 09/10/17  Biagio Borg, MD  pantoprazole (PROTONIX) 40 MG tablet Take 1 tablet (40 mg total) by mouth daily. 03/11/16   Biagio Borg, MD  triamcinolone cream (KENALOG) 0.1 % Apply 1 application topically 2 (two) times daily. 02/27/16   Biagio Borg, MD   Social History   Socioeconomic History  . Marital status: Married    Spouse name: Not on file  .  Number of children: 5  . Years of education: Not on file  . Highest education level: Not on file  Occupational History  . Occupation: previously worked in Personal assistant and Cabin crew  Social Needs  . Financial resource strain: Not on file  . Food insecurity:    Worry: Not on file    Inability: Not on file  . Transportation needs:    Medical: Not on file    Non-medical: Not on file  Tobacco Use  . Smoking status: Former Research scientist (life sciences)  . Smokeless tobacco: Never Used  Substance and Sexual Activity  . Alcohol use: No    Alcohol/week: 0.0 oz  . Drug use: No  . Sexual activity: Not on file  Lifestyle  . Physical activity:    Days per week: Not on file    Minutes per session: Not on file  . Stress: Not on file  Relationships  . Social connections:    Talks on phone: Not on file    Gets together: Not on file    Attends religious service: Not on file    Active member of club or organization: Not on file    Attends meetings of clubs or organizations: Not on file    Relationship status: Not on file  . Intimate partner violence:    Fear of current or ex partner: Not on file    Emotionally abused: Not on file    Physically abused: Not on file    Forced sexual activity: Not on file  Other Topics Concern  . Not on file  Social History Narrative  . Not on file   Review of Systems  Constitutional: Negative for chills, diaphoresis, fatigue, fever and unexpected weight change.  Eyes: Negative for visual disturbance.  Respiratory: Negative for cough, chest tightness and shortness of breath.   Cardiovascular: Negative for chest pain, palpitations and leg swelling.  Gastrointestinal: Negative for abdominal pain, blood in stool, diarrhea, nausea and vomiting.  Skin: Positive for color change and rash.       Tick bite  Neurological: Negative for dizziness, weakness, light-headedness, numbness and headaches.       Objective:   Physical Exam  Constitutional: He is oriented to person,  place, and time. He appears well-developed and well-nourished. No distress.  HENT:  Head: Normocephalic and atraumatic.  Eyes: Pupils are equal, round, and reactive to light. EOM are normal.  Neck: Neck supple.  Cardiovascular: Normal rate.  Pulmonary/Chest: Effort normal. No respiratory distress.  Musculoskeletal: Normal range of motion.  Neurological: He is alert and oriented to person, place, and time.  Skin: Skin is warm and dry. Rash noted.  Area of where tick was removed was at the left lower abdominal wall at the lateral aspect of the inguinal fold, don't see any remaining tick parts; there is faint pink appearance to that area with a small papular appearance with healing skin, no significant induration, no apparently surrounding  erythema, the pink appearance is approximately 3-4 mm only; inguinal fold, there is a different appearing rash with erythema and fine papules with a more confluent erythematous rash, slight scale at the lower inguinal fold, I don't see any rash on the penis or scrotum; there is a similar appearing erythematous patch over the upper cluneal cleft, lower back with erythema; there was no discharge or surrounding erythema from either rash  Psychiatric: He has a normal mood and affect. His behavior is normal.  Nursing note and vitals reviewed.   Vitals:   04/22/18 1449  BP: 124/60  Pulse: 74  Temp: 97.7 F (36.5 C)  TempSrc: Oral  SpO2: 96%  Weight: 181 lb (82.1 kg)  Height: 5\' 11"  (1.803 m)       Assessment & Plan:    Connor Morgan is a 82 y.o. male Tinea cruris - Plan: clotrimazole-betamethasone (LOTRISONE) cream  Tick bite of abdominal wall, initial encounter  Previous area of tick bite overall looks okay today without signs of secondary infection.  Reassuring that that tick was present less than 24 hours, unlikely transmission of tickborne illness.  Continue to monitor for improved appearance/healing with RTC precautions  Inguinal rash as well as  rash at the top of the buttocks appears to be fungal/tinea cruris.  Start Lotrisone twice daily, handout given, RTC precautions.  Meds ordered this encounter  Medications  . clotrimazole-betamethasone (LOTRISONE) cream    Sig: Apply 1 application topically 2 (two) times daily.    Dispense:  30 g    Refill:  0   Patient Instructions   Area of previous tick bite appears to be healing with new skin.  I do not see any signs of infection around that previous tick bite and based on the amount of time that tick was present I do not think that you need antibiotics at this time.  If any increased redness or swelling around the previous area of tick bite, return right away.  Redness in the fold of the groin as well as the upper buttock area appears to be due to fungal infection.  Start the antifungal/steroid cream twice per day until those symptoms have improved.  See other information below.  Thank you for coming in today.  Jock Itch Jock itch (tinea cruris) is a fungal infection of the skin in the groin area. It is sometimes called ringworm, even though it is not caused by worms. It is caused by a fungus, which is a type of germ that thrives in dark, damp places. Jock itch causes a rash and itching in the groin and upper thigh area. It usually goes away in 2-3 weeks with treatment. What are the causes? The fungus that causes jock itch may be spread by:  Touching a fungus infection elsewhere on your body-such as athlete's foot-and then touching your groin area.  Sharing towels or clothing with an infected person.  What increases the risk? Jock itch is most common in men and adolescent boys. This condition is more likely to develop from:  Being in hot, humid climates.  Wearing tight-fitting clothing or wet bathing suits for long periods of time.  Participating in sports.  Being overweight.  Having diabetes.  What are the signs or symptoms? Symptoms of jock itch may include:  A red,  pink, or brown rash in the groin area. The rash may spread to the thighs, anus, and buttocks.  Dry and scaly skin on or around the rash.  Itchiness.  How is  this diagnosed? Most often, a health care provider can make the diagnosis by looking at your rash. Sometimes, a scraping of the infected skin will be taken. This sample may be tested by looking at it under a microscope or by trying to grow the fungus from the sample (culture). How is this treated? Treatment for this condition may include:  Antifungal medicine to kill the fungus. This may be in various forms: ? Skin cream or ointment. ? Medicine taken by mouth.  Skin cream or ointment to reduce the itching.  Compresses or medicated powders to dry the infected skin.  Follow these instructions at home:  Take medicines only as directed by your health care provider. Apply skin creams or ointments exactly as directed.  Wear loose-fitting clothing. ? Men should wear cotton boxer shorts. ? Women should wear cotton underwear.  Change your underwear every day to keep your groin dry.  Avoid hot baths.  Dry your groin area well after bathing. ? Use a separate towel to dry your groin area. This will help to prevent a spreading of the infection to other areas of your body.  Do not scratch the affected area.  Do not share towels with other people. Contact a health care provider if:  Your rash does not improve or it gets worse after 2 weeks of treatment.  Your rash is spreading.  Your rash returns after treatment is finished.  You have a fever.  You have redness, swelling, or pain in the area around your rash.  You have fluid, blood, or pus coming from your rash.  Your have your rash for more than 4 weeks. This information is not intended to replace advice given to you by your health care provider. Make sure you discuss any questions you have with your health care provider. Document Released: 11/13/2002 Document Revised:  04/30/2016 Document Reviewed: 09/04/2014 Elsevier Interactive Patient Education  2018 [place], Adult Ticks are insects that can bite. Most ticks live in shrubs and grassy areas. They climb onto people and animals that go by. Then they bite. Some ticks carry germs that can make you sick. How can I prevent tick bites?  Use an insect repellent that has 20% or higher of the ingredients DEET, picaridin, or IR3535. Put this insect repellent on: ? Bare skin. ? The tops of your boots. ? Your pant legs. ? The ends of your sleeves.  If you use an insect repellent that has the ingredient permethrin, make sure to follow the instructions on the bottle. Treat the following: ? Clothing. ? Supplies. ? Boots. ? Tents.  Wear long sleeves, long pants, and light colors.  Tuck your pant legs into your socks.  Stay in the middle of the trail.  Try not to walk through long grass.  Before going inside your house, check your clothes, hair, and skin for ticks. Make sure to check your head, neck, armpits, waist, groin, and joint areas.  Check for ticks every day.  When you come indoors: ? Wash your clothes right away. ? Shower right away. ? Dry your clothes in a dryer on high heat for 60 minutes or more. What is the right way to remove a tick? Remove a tick from your skin as soon as possible.  To remove a tick that is crawling on your skin: ? Go outdoors and brush the tick off. ? Use tape or a lint roller.  To remove a tick that is biting: ? Wash your  hands. ? If you have latex gloves, put them on. ? Use tweezers, curved forceps, or a tick-removal tool to grasp the tick. Grasp the tick as close to your skin and as close to the tick's head as possible. ? Gently pull up until the tick lets go.  Try to keep the tick's head attached to its body.  Do not twist or jerk the tick.  Do not squeeze or crush the tick.  Do not try to remove a tick with heat, alcohol,  petroleum jelly, or fingernail polish. How should I get rid of a tick? Here are some ways to get rid of a tick that is alive:  Place the tick in rubbing alcohol.  Place the tick in a bag or container you can close tightly.  Wrap the tick tightly in tape.  Flush the tick down the toilet.  Contact a doctor if:  You have symptoms of a disease, such as: ? Pain in a muscle, joint, or bone. ? Trouble walking or moving your legs. ? Numbness in your legs. ? Inability to move (paralysis). ? A red rash that makes a circle (bull's-eye rash). ? Redness and swelling where the tick bit you. ? A fever. ? Throwing up (vomiting) over and over. ? Diarrhea. ? Weight loss. ? Tender and swollen lymph glands. ? Shortness of breath. ? Cough. ? Belly pain (abdominal pain). ? Headache. ? Being more tired than normal. ? A change in how alert (conscious) you are. ? Confusion. Get help right away if:  You cannot remove a tick.  A part of a tick breaks off and gets stuck in your skin.  You are feeling worse. Summary  Ticks may carry germs that can make you sick.  To prevent tick bites, wear long sleeves, long pants, and light colors. Use insect repellent. Follow the instructions on the bottle.  If the tick is biting, do not try to remove it with heat, alcohol, petroleum jelly, or fingernail polish.  Use tweezers, curved forceps, or a tick-removal tool to grasp the tick. Gently pull up until the tick lets go. Do not twist or jerk the tick. Do not squeeze or crush the tick.  If you have symptoms, contact a doctor. This information is not intended to replace advice given to you by your health care provider. Make sure you discuss any questions you have with your health care provider. Document Released: 02/17/2010 Document Revised: 03/05/2017 Document Reviewed: 03/05/2017 Elsevier Interactive Patient Education  2018 [employer].   IF you received an x-ray today, you will receive an invoice  from [facility]. Please contact [facility] at [phone] with questions or concerns regarding your invoice.   IF you received labwork today, you will receive an invoice from [place]. Please contact [facility] at [phone] with questions or concerns regarding your invoice.   Our billing staff will not be able to assist you with questions regarding bills from these companies.  You will be contacted with the lab results as soon as they are available. The fastest way to get your results is to activate your My Chart account. Instructions are located on the last page of this paperwork. If you have not heard from [place] regarding the results in 2 weeks, please contact this office.       I personally performed the services described in this documentation, which was scribed in my presence. The recorded information has been reviewed and considered for accuracy and completeness, addended by me as needed, and agree  with information above.  Signed,   Merri Ray, MD [facility] [place].  04/22/18 3:23 PM

## 2018-04-22 NOTE — Patient Instructions (Addendum)
Area of previous tick bite appears to be healing with new skin.  I do not see any signs of infection around that previous tick bite and based on the amount of time that tick was present I do not think that you need antibiotics at this time.  If any increased redness or swelling around the previous area of tick bite, return right away.  Redness in the fold of the groin as well as the upper buttock area appears to be due to fungal infection.  Start the antifungal/steroid cream twice per day until those symptoms have improved.  See other information below.  Thank you for coming in today.  Jock Itch Jock itch (tinea cruris) is a fungal infection of the skin in the groin area. It is sometimes called ringworm, even though it is not caused by worms. It is caused by a fungus, which is a type of germ that thrives in dark, damp places. Jock itch causes a rash and itching in the groin and upper thigh area. It usually goes away in 2-3 weeks with treatment. What are the causes? The fungus that causes jock itch may be spread by:  Touching a fungus infection elsewhere on your body-such as athlete's foot-and then touching your groin area.  Sharing towels or clothing with an infected person.  What increases the risk? Jock itch is most common in men and adolescent boys. This condition is more likely to develop from:  Being in hot, humid climates.  Wearing tight-fitting clothing or wet bathing suits for long periods of time.  Participating in sports.  Being overweight.  Having diabetes.  What are the signs or symptoms? Symptoms of jock itch may include:  A red, pink, or brown rash in the groin area. The rash may spread to the thighs, anus, and buttocks.  Dry and scaly skin on or around the rash.  Itchiness.  How is this diagnosed? Most often, a health care provider can make the diagnosis by looking at your rash. Sometimes, a scraping of the infected skin will be taken. This sample may be tested by  looking at it under a microscope or by trying to grow the fungus from the sample (culture). How is this treated? Treatment for this condition may include:  Antifungal medicine to kill the fungus. This may be in various forms: ? Skin cream or ointment. ? Medicine taken by mouth.  Skin cream or ointment to reduce the itching.  Compresses or medicated powders to dry the infected skin.  Follow these instructions at home:  Take medicines only as directed by your health care provider. Apply skin creams or ointments exactly as directed.  Wear loose-fitting clothing. ? Men should wear cotton boxer shorts. ? Women should wear cotton underwear.  Change your underwear every day to keep your groin dry.  Avoid hot baths.  Dry your groin area well after bathing. ? Use a separate towel to dry your groin area. This will help to prevent a spreading of the infection to other areas of your body.  Do not scratch the affected area.  Do not share towels with other people. Contact a health care provider if:  Your rash does not improve or it gets worse after 2 weeks of treatment.  Your rash is spreading.  Your rash returns after treatment is finished.  You have a fever.  You have redness, swelling, or pain in the area around your rash.  You have fluid, blood, or pus coming from your rash.  Your have  your rash for more than 4 weeks. This information is not intended to replace advice given to you by your health care provider. Make sure you discuss any questions you have with your health care provider. Document Released: 11/13/2002 Document Revised: 04/30/2016 Document Reviewed: 09/04/2014 Elsevier Interactive Patient Education  2018 [place], Adult Ticks are insects that can bite. Most ticks live in shrubs and grassy areas. They climb onto people and animals that go by. Then they bite. Some ticks carry germs that can make you sick. How can I prevent tick  bites?  Use an insect repellent that has 20% or higher of the ingredients DEET, picaridin, or IR3535. Put this insect repellent on: ? Bare skin. ? The tops of your boots. ? Your pant legs. ? The ends of your sleeves.  If you use an insect repellent that has the ingredient permethrin, make sure to follow the instructions on the bottle. Treat the following: ? Clothing. ? Supplies. ? Boots. ? Tents.  Wear long sleeves, long pants, and light colors.  Tuck your pant legs into your socks.  Stay in the middle of the trail.  Try not to walk through long grass.  Before going inside your house, check your clothes, hair, and skin for ticks. Make sure to check your head, neck, armpits, waist, groin, and joint areas.  Check for ticks every day.  When you come indoors: ? Wash your clothes right away. ? Shower right away. ? Dry your clothes in a dryer on high heat for 60 minutes or more. What is the right way to remove a tick? Remove a tick from your skin as soon as possible.  To remove a tick that is crawling on your skin: ? Go outdoors and brush the tick off. ? Use tape or a lint roller.  To remove a tick that is biting: ? Wash your hands. ? If you have latex gloves, put them on. ? Use tweezers, curved forceps, or a tick-removal tool to grasp the tick. Grasp the tick as close to your skin and as close to the tick's head as possible. ? Gently pull up until the tick lets go.  Try to keep the tick's head attached to its body.  Do not twist or jerk the tick.  Do not squeeze or crush the tick.  Do not try to remove a tick with heat, alcohol, petroleum jelly, or fingernail polish. How should I get rid of a tick? Here are some ways to get rid of a tick that is alive:  Place the tick in rubbing alcohol.  Place the tick in a bag or container you can close tightly.  Wrap the tick tightly in tape.  Flush the tick down the toilet.  Contact a doctor if:  You have symptoms of a  disease, such as: ? Pain in a muscle, joint, or bone. ? Trouble walking or moving your legs. ? Numbness in your legs. ? Inability to move (paralysis). ? A red rash that makes a circle (bull's-eye rash). ? Redness and swelling where the tick bit you. ? A fever. ? Throwing up (vomiting) over and over. ? Diarrhea. ? Weight loss. ? Tender and swollen lymph glands. ? Shortness of breath. ? Cough. ? Belly pain (abdominal pain). ? Headache. ? Being more tired than normal. ? A change in how alert (conscious) you are. ? Confusion. Get help right away if:  You cannot remove a tick.  A part of a tick  breaks off and gets stuck in your skin.  You are feeling worse. Summary  Ticks may carry germs that can make you sick.  To prevent tick bites, wear long sleeves, long pants, and light colors. Use insect repellent. Follow the instructions on the bottle.  If the tick is biting, do not try to remove it with heat, alcohol, petroleum jelly, or fingernail polish.  Use tweezers, curved forceps, or a tick-removal tool to grasp the tick. Gently pull up until the tick lets go. Do not twist or jerk the tick. Do not squeeze or crush the tick.  If you have symptoms, contact a doctor. This information is not intended to replace advice given to you by your health care provider. Make sure you discuss any questions you have with your health care provider. Document Released: 02/17/2010 Document Revised: 03/05/2017 Document Reviewed: 03/05/2017 Elsevier Interactive Patient Education  2018 [employer].   IF you received an x-ray today, you will receive an invoice from [facility]. Please contact [facility] at [phone] with questions or concerns regarding your invoice.   IF you received labwork today, you will receive an invoice from [facility]. Please contact [facility] at [phone] with questions or concerns regarding your invoice.   Our billing staff will not be able to assist  you with questions regarding bills from these companies.  You will be contacted with the lab results as soon as they are available. The fastest way to get your results is to activate your My Chart account. Instructions are located on the last page of this paperwork. If you have not heard from [place] regarding the results in 2 weeks, please contact this office.

## 2018-07-27 NOTE — Progress Notes (Addendum)
Subjective:   Connor Morgan is a 82 y.o. male who presents for an Initial Medicare Annual Wellness Visit.  Review of Systems  No ROS.  Medicare Wellness Visit. Additional risk factors are reflected in the social history.  Cardiac Risk Factors include: advanced age (>17men, >74 women);dyslipidemia;hypertension;male gender Sleep patterns: feels rested on waking, gets up 2-3 times nightly to void and sleeps 9 hours nightly.    Home Safety/Smoke Alarms: Feels safe in home. Smoke alarms in place.  Living environment; residence and Firearm Safety: 1-story house/ trailer, no firearms. Lives with wife, no needs for DME, good support system Seat Belt Safety/Bike Helmet: Wears seat belt.   PSA-  Lab Results  Component Value Date   PSA 1.96 09/21/2017   PSA 1.64 09/10/2016   PSA 2.21 01/03/2016      Objective:    Today's Vitals   07/28/18 1320  BP: 121/78  Pulse: 62  Resp: 18  SpO2: 99%  Weight: 171 lb (77.6 kg)  Height: 5\' 11"  (1.803 m)   Body mass index is 23.85 kg/m.  Advanced Directives 07/28/2018 03/11/2017 05/01/2016  Does Patient Have a Medical Advance Directive? Yes Yes Yes  Type of Paramedic of [place];Living will [place];Living will -  Copy of Alpha in Chart? No - copy requested No - copy requested No - copy requested    Current Medications (verified) Outpatient Encounter Medications as of 07/28/2018  Medication Sig  . amLODipine (NORVASC) 5 MG tablet Take 1 tablet (5 mg total) by mouth daily.  Marland Kitchen aspirin 81 MG tablet Take 81 mg by mouth daily.    Marland Kitchen atorvastatin (LIPITOR) 10 MG tablet Take 1 tablet (10 mg total) by mouth daily.  . clotrimazole-betamethasone (LOTRISONE) cream Apply 1 application topically 2 (two) times daily.  . [DISCONTINUED] amLODipine (NORVASC) 5 MG tablet Take 1 tablet (5 mg total) by mouth daily.   No facility-administered encounter medications on file as of 07/28/2018.      Allergies (verified) Mevacor [lovastatin]   History: Past Medical History:  Diagnosis Date  . ALLERGIC RHINITIS 10/19/2007  . ALLERGY 08/13/2007  . BENIGN PROSTATIC HYPERTROPHY 10/19/2007  . CHEST PAIN, ATYPICAL 12/03/2008  . DYSPNEA 11/20/2008  . ERECTILE DYSFUNCTION 10/19/2007  . FATIGUE 10/19/2007  . FREQUENCY, URINARY 11/20/2008  . Gastric ulcer   . GERD 10/19/2007  . GLUCOSE INTOLERANCE 10/19/2007  . HEMORRHOIDS, HX OF 08/13/2007  . HYPERLIPIDEMIA 10/19/2007  . HYPERTENSION 08/13/2007  . Impaired glucose tolerance 07/08/2011  . NEPHROLITHIASIS 08/13/2007  . NEPHROLITHIASIS, HX OF 10/19/2007  . ORCHIECTOMY, HX OF 10/19/2007  . Spermatocele 08/13/2007   Past Surgical History:  Procedure Laterality Date  . INGUINAL HERNIA REPAIR Bilateral   . s/p right orchiectomy     benign mass, right  . SKIN LESION EXCISION     neck  . TONSILLECTOMY     Family History  Problem Relation Age of Onset  . Dementia Mother   . Kidney disease Mother   . Heart disease Father   . Dementia Father   . Heart disease Brother        x 2   Social History   Socioeconomic History  . Marital status: Married    Spouse name: Not on file  . Number of children: 5  . Years of education: Not on file  . Highest education level: Not on file  Occupational History  . Occupation: previously worked in Personal assistant and Cabin crew  Social  Needs  . Financial resource strain: Not very hard  . Food insecurity:    Worry: Never true    Inability: Never true  . Transportation needs:    Medical: No    Non-medical: No  Tobacco Use  . Smoking status: Former Research scientist (life sciences)  . Smokeless tobacco: Never Used  Substance and Sexual Activity  . Alcohol use: No    Alcohol/week: 0.0 standard drinks  . Drug use: No  . Sexual activity: Not Currently  Lifestyle  . Physical activity:    Days per week: 5 days    Minutes per session: 50 min  . Stress: Not at all  Relationships  . Social connections:    Talks on phone:  More than three times a week    Gets together: More than three times a week    Attends religious service: Never    Active member of club or organization: Yes    Attends meetings of clubs or organizations: More than 4 times per year    Relationship status: Married  Other Topics Concern  . Not on file  Social History Narrative  . Not on file   Tobacco Counseling Counseling given: Not Answered  Activities of Daily Living In your present state of health, do you have any difficulty performing the following activities: 07/28/2018  Hearing? Y  Vision? N  Difficulty concentrating or making decisions? N  Walking or climbing stairs? N  Dressing or bathing? N  Doing errands, shopping? N  Preparing Food and eating ? N  Using the Toilet? N  In the past six months, have you accidently leaked urine? N  Do you have problems with loss of bowel control? N  Managing your Medications? N  Managing your Finances? N  Housekeeping or managing your Housekeeping? N  Some recent data might be hidden     Immunizations and Health Maintenance Immunization History  Administered Date(s) Administered  . Influenza Whole 10/19/2007, 10/09/2008  . Influenza, High Dose Seasonal PF 09/21/2017  . Influenza,inj,Quad PF,6+ Mos 12/22/2013  . Pneumococcal Conjugate-13 01/05/2014  . Pneumococcal Polysaccharide-23 10/19/2007  . Td 12/07/1994, 11/20/2008  . Tdap 05/26/2016   Health Maintenance Due  Topic Date Due  . INFLUENZA VACCINE  07/07/2018    Patient Care Team: Biagio Borg, MD as PCP - General  Indicate any recent Medical Services you may have received from other than [facility] providers in the past year (date may be approximate).    Assessment:   This is a routine wellness examination for [place]. Physical assessment deferred to PCP.   Hearing/Vision screen Hearing Screening Comments: HOH, states [facility]. Vision Screening Comments: appointment every 3-4 months,  receives eye care through the [place]. States he has macular issues.   Dietary issues and exercise activities discussed: Current Exercise Habits: Home exercise routine, Type of exercise: walking, Time (Minutes): 45, Frequency (Times/Week): 4, Weekly Exercise (Minutes/Week): 180, Intensity: Mild, Exercise limited by: None identified  Diet (meal preparation, eat out, water intake, caffeinated beverages, dairy products, fruits and vegetables): in general, a "healthy" diet  , well balanced.   Reviewed heart healthy diet. Encouraged patient to increase daily water and healthy fluid intake.  Goals    . Maintain current health status     Continue to be healthy, eat healthy, and enjoy life.    . Patient Stated     Stay as active physically and socially as possible. Enjoy life and go to the beach often.  Depression Screen PHQ 2/9 Scores 07/28/2018 04/22/2018 09/21/2017 03/11/2017  PHQ - 2 Score 0 0 0 0    Fall Risk Fall Risk  07/28/2018 04/22/2018 09/21/2017 03/11/2017 03/11/2017  Falls in the past year? No No No No No    Cognitive Function: MMSE - Mini Mental State Exam 07/28/2018  Orientation to time 5  Orientation to Place 5  Registration 3  Attention/ Calculation 5  Recall 2  Language- name 2 objects 2  Language- repeat 1  Language- follow 3 step command 3  Language- read & follow direction 1  Write a sentence 1  Copy design 1  Total score 29        Screening Tests Health Maintenance  Topic Date Due  . INFLUENZA VACCINE  07/07/2018  . TETANUS/TDAP  05/26/2026  . PNA vac Low Risk Adult  Completed       Plan:     Continue doing brain stimulating activities (puzzles, reading, adult coloring books, staying active) to keep memory sharp.   Continue to eat heart healthy diet (full of fruits, vegetables, whole grains, lean protein, water--limit salt, fat, and sugar intake) and increase physical activity as tolerated.  I have personally reviewed and noted the following in the  patient's chart:   . Medical and social history . Use of alcohol, tobacco or illicit drugs  . Current medications and supplements . Functional ability and status . Nutritional status . Physical activity . Advanced directives . List of other physicians . Vitals . Screenings to include cognitive, depression, and falls . Referrals and appointments  In addition, I have reviewed and discussed with patient certain preventive protocols, quality metrics, and best practice recommendations. A written personalized care plan for preventive services as well as general preventive health recommendations were provided to patient.     Michiel Cowboy, RN   07/28/2018      Medical screening examination/treatment/procedure(s) were performed by non-physician practitioner and as supervising physician I was immediately available for consultation/collaboration. I agree with above. Cathlean Cower, MD

## 2018-07-28 ENCOUNTER — Ambulatory Visit: Payer: Medicare Other | Admitting: Internal Medicine

## 2018-07-28 ENCOUNTER — Ambulatory Visit (INDEPENDENT_AMBULATORY_CARE_PROVIDER_SITE_OTHER): Payer: Medicare Other | Admitting: *Deleted

## 2018-07-28 VITALS — BP 121/78 | HR 62 | Resp 18 | Ht 71.0 in | Wt 171.0 lb

## 2018-07-28 DIAGNOSIS — Z0000 Encounter for general adult medical examination without abnormal findings: Secondary | ICD-10-CM

## 2018-07-28 MED ORDER — AMLODIPINE BESYLATE 5 MG PO TABS: 5.0000 mg | ORAL_TABLET | Freq: Every day | ORAL | 2 refills | Status: DC

## 2018-07-28 NOTE — Patient Instructions (Addendum)
Continue doing brain stimulating activities (puzzles, reading, adult coloring books, staying active) to keep memory sharp.   Continue to eat heart healthy diet (full of fruits, vegetables, whole grains, lean protein, water--limit salt, fat, and sugar intake) and increase physical activity as tolerated.   Connor Morgan , Thank you for taking time to come for your Medicare Wellness Visit. I appreciate your ongoing commitment to your health goals. Please review the following plan we discussed and let me know if I can assist you in the future.   These are the goals we discussed: Goals    . Maintain current health status     Continue to be healthy, eat healthy, and enjoy life.    . Patient Stated     Stay as active physically and socially as possible. Enjoy life and go to the beach often.        This is a list of the screening recommended for you and due dates:  Health Maintenance  Topic Date Due  . Flu Shot  07/07/2018  . Tetanus Vaccine  05/26/2026  . Pneumonia vaccines  Completed    Health Maintenance, Male A healthy lifestyle and preventive care is important for your health and wellness. Ask your health care provider about what schedule of regular examinations is right for you. What should I know about weight and diet? Eat a Healthy Diet  Eat plenty of vegetables, fruits, whole grains, low-fat dairy products, and lean protein.  Do not eat a lot of foods high in solid fats, added sugars, or salt.  Maintain a Healthy Weight Regular exercise can help you achieve or maintain a healthy weight. You should:  Do at least 150 minutes of exercise each week. The exercise should increase your heart rate and make you sweat (moderate-intensity exercise).  Do strength-training exercises at least twice a week.  Watch Your Levels of Cholesterol and Blood Lipids  Have your blood tested for lipids and cholesterol every 5 years starting at 80 years of age. If you are at high risk for heart disease,  you should start having your blood tested when you are 80 years old. You may need to have your cholesterol levels checked more often if: ? Your lipid or cholesterol levels are high. ? You are older than 80 years of age. ? You are at high risk for heart disease.  What should I know about cancer screening? Many types of cancers can be detected early and may often be prevented. Lung Cancer  You should be screened every year for lung cancer if: ? You are a current smoker who has smoked for at least 30 years. ? You are a former smoker who has quit within the past 15 years.  Talk to your health care provider about your screening options, when you should start screening, and how often you should be screened.  Colorectal Cancer  Routine colorectal cancer screening usually begins at 80 years of age and should be repeated every 5-10 years until you are 80 years old. You may need to be screened more often if early forms of precancerous polyps or small growths are found. Your health care provider may recommend screening at an earlier age if you have risk factors for colon cancer.  Your health care provider may recommend using home test kits to check for hidden blood in the stool.  A small camera at the end of a tube can be used to examine your colon (sigmoidoscopy or colonoscopy). This checks for the earliest forms  of colorectal cancer.  Prostate and Testicular Cancer  Depending on your age and overall health, your health care provider may do certain tests to screen for prostate and testicular cancer.  Talk to your health care provider about any symptoms or concerns you have about testicular or prostate cancer.  Skin Cancer  Check your skin from head to toe regularly.  Tell your health care provider about any new moles or changes in moles, especially if: ? There is a change in a mole's size, shape, or color. ? You have a mole that is larger than a pencil eraser.  Always use sunscreen. Apply  sunscreen liberally and repeat throughout the day.  Protect yourself by wearing long sleeves, pants, a wide-brimmed hat, and sunglasses when outside.  What should I know about heart disease, diabetes, and high blood pressure?  If you are 21-64 years of age, have your blood pressure checked every 3-5 years. If you are 68 years of age or older, have your blood pressure checked every year. You should have your blood pressure measured twice-once when you are at a hospital or clinic, and once when you are not at a hospital or clinic. Record the average of the two measurements. To check your blood pressure when you are not at a hospital or clinic, you can use: ? An automated blood pressure machine at a pharmacy. ? A home blood pressure monitor.  Talk to your health care provider about your target blood pressure.  If you are between 52-78 years old, ask your health care provider if you should take aspirin to prevent heart disease.  Have regular diabetes screenings by checking your fasting blood sugar level. ? If you are at a normal weight and have a low risk for diabetes, have this test once every three years after the age of 25. ? If you are overweight and have a high risk for diabetes, consider being tested at a younger age or more often.  A one-time screening for abdominal aortic aneurysm (AAA) by ultrasound is recommended for men aged 63-75 years who are current or former smokers. What should I know about preventing infection? Hepatitis B If you have a higher risk for hepatitis B, you should be screened for this virus. Talk with your health care provider to find out if you are at risk for hepatitis B infection. Hepatitis C Blood testing is recommended for:  Everyone born from [birth year] through 1963.  Anyone with known risk factors for hepatitis C.  Sexually Transmitted Diseases (STDs)  You should be screened each year for STDs including gonorrhea and chlamydia if: ? You are sexually active  and are younger than 80 years of age. ? You are older than 80 years of age and your health care provider tells you that you are at risk for this type of infection. ? Your sexual activity has changed since you were last screened and you are at an increased risk for chlamydia or gonorrhea. Ask your health care provider if you are at risk.  Talk with your health care provider about whether you are at high risk of being infected with HIV. Your health care provider may recommend a prescription medicine to help prevent HIV infection.  What else can I do?  Schedule regular health, dental, and eye exams.  Stay current with your vaccines (immunizations).  Do not use any tobacco products, such as cigarettes, chewing tobacco, and e-cigarettes. If you need help quitting, ask your health care provider.  Limit alcohol intake to  no more than 2 drinks per day. One drink equals 12 ounces of beer, 5 ounces of Hosanna Betley, or 1 ounces of hard liquor.  Do not use street drugs.  Do not share needles.  Ask your health care provider for help if you need support or information about quitting drugs.  Tell your health care provider if you often feel depressed.  Tell your health care provider if you have ever been abused or do not feel safe at home. This information is not intended to replace advice given to you by your health care provider. Make sure you discuss any questions you have with your health care provider. Document Released: 05/21/2008 Document Revised: 07/22/2016 Document Reviewed: 08/27/2015 Elsevier Interactive Patient Education  [employer].

## 2018-07-28 NOTE — Addendum Note (Signed)
Addended by: Emelia Loron A on: 07/28/2018 02:16 PM   Modules accepted: Level of Service, SmartSet

## 2018-08-02 ENCOUNTER — Encounter: Payer: Self-pay | Admitting: Internal Medicine

## 2018-08-02 ENCOUNTER — Other Ambulatory Visit (INDEPENDENT_AMBULATORY_CARE_PROVIDER_SITE_OTHER): Payer: Medicare Other

## 2018-08-02 ENCOUNTER — Ambulatory Visit (INDEPENDENT_AMBULATORY_CARE_PROVIDER_SITE_OTHER): Payer: Medicare Other | Admitting: Internal Medicine

## 2018-08-02 ENCOUNTER — Ambulatory Visit (INDEPENDENT_AMBULATORY_CARE_PROVIDER_SITE_OTHER)
Admission: RE | Admit: 2018-08-02 | Discharge: 2018-08-02 | Disposition: A | Discharge status: Home / Self Care | Payer: Medicare Other | Source: Ambulatory Visit | Attending: Internal Medicine | Admitting: Internal Medicine

## 2018-08-02 VITALS — BP 134/82 | HR 70 | Temp 97.8°F | Ht 71.0 in | Wt 174.0 lb

## 2018-08-02 DIAGNOSIS — I10 Essential (primary) hypertension: Secondary | ICD-10-CM

## 2018-08-02 DIAGNOSIS — R350 Frequency of micturition: Secondary | ICD-10-CM

## 2018-08-02 DIAGNOSIS — R079 Chest pain, unspecified: Secondary | ICD-10-CM | POA: Insufficient documentation

## 2018-08-02 DIAGNOSIS — E785 Hyperlipidemia, unspecified: Secondary | ICD-10-CM

## 2018-08-02 DIAGNOSIS — R1310 Dysphagia, unspecified: Secondary | ICD-10-CM | POA: Diagnosis not present

## 2018-08-02 LAB — CBC WITH DIFFERENTIAL/PLATELET
BASOS ABS: 0.1 10*3/uL (ref 0.0–0.1)
Basophils Relative: 0.7 % (ref 0.0–3.0)
EOS ABS: 0.2 10*3/uL (ref 0.0–0.7)
Eosinophils Relative: 2.6 % (ref 0.0–5.0)
HEMATOCRIT: 40.5 % (ref 39.0–52.0)
Hemoglobin: 14.4 g/dL (ref 13.0–17.0)
Lymphocytes Relative: 24.9 % (ref 12.0–46.0)
Lymphs Abs: 1.7 10*3/uL (ref 0.7–4.0)
MCHC: 35.5 g/dL (ref 30.0–36.0)
MCV: 90.3 fl (ref 78.0–100.0)
Monocytes Absolute: 0.6 10*3/uL (ref 0.1–1.0)
Monocytes Relative: 8.9 % (ref 3.0–12.0)
NEUTROS ABS: 4.3 10*3/uL (ref 1.4–7.7)
NEUTROS PCT: 62.9 % (ref 43.0–77.0)
PLATELETS: 182 10*3/uL (ref 150.0–400.0)
RBC: 4.48 Mil/uL (ref 4.22–5.81)
RDW: 13.2 % (ref 11.5–15.5)
WBC: 6.9 10*3/uL (ref 4.0–10.5)

## 2018-08-02 LAB — URINALYSIS, ROUTINE W REFLEX MICROSCOPIC
Bilirubin Urine: NEGATIVE
HGB URINE DIPSTICK: NEGATIVE
Ketones, ur: NEGATIVE
LEUKOCYTES UA: NEGATIVE
Nitrite: NEGATIVE
RBC / HPF: NONE SEEN (ref 0–?)
Specific Gravity, Urine: 1.01 (ref 1.000–1.030)
Total Protein, Urine: NEGATIVE
Urine Glucose: NEGATIVE
Urobilinogen, UA: 0.2 (ref 0.0–1.0)
pH: 7 (ref 5.0–8.0)

## 2018-08-02 LAB — TSH: TSH: 2.09 u[IU]/mL (ref 0.35–4.50)

## 2018-08-02 LAB — BASIC METABOLIC PANEL
BUN: 13 mg/dL (ref 6–23)
CO2: 31 meq/L (ref 19–32)
CREATININE: 0.84 mg/dL (ref 0.40–1.50)
Calcium: 9.8 mg/dL (ref 8.4–10.5)
Chloride: 105 mEq/L (ref 96–112)
GFR: 93.34 mL/min (ref >60.00)
GLUCOSE: 80 mg/dL (ref 70–99)
Potassium: 4 mEq/L (ref 3.5–5.1)
Sodium: 141 mEq/L (ref 135–145)

## 2018-08-02 LAB — LIPID PANEL
Cholesterol: 146 mg/dL (ref 0–200)
HDL: 50.8 mg/dL (ref >39.00)
LDL CALC: 73 mg/dL (ref 0–99)
NonHDL: 95.48
Total CHOL/HDL Ratio: 3
Triglycerides: 114 mg/dL (ref 0.0–149.0)
VLDL: 22.8 mg/dL (ref 0.0–40.0)

## 2018-08-02 LAB — HEPATIC FUNCTION PANEL
ALK PHOS: 102 U/L (ref 39–117)
ALT: 17 U/L (ref 0–53)
AST: 17 U/L (ref 0–37)
Albumin: 4.4 g/dL (ref 3.5–5.2)
BILIRUBIN DIRECT: 0.1 mg/dL (ref 0.0–0.3)
TOTAL PROTEIN: 6.8 g/dL (ref 6.0–8.3)
Total Bilirubin: 0.6 mg/dL (ref 0.2–1.2)

## 2018-08-02 MED ORDER — OXYBUTYNIN CHLORIDE ER 5 MG PO TB24: 5.0000 mg | ORAL_TABLET | Freq: Every day | ORAL | 3 refills | Status: DC

## 2018-08-02 NOTE — Assessment & Plan Note (Signed)
Suspect large right sternoclavicular enlargement, for cxr, consider CT

## 2018-08-02 NOTE — Assessment & Plan Note (Signed)
stable overall by history and exam, recent data reviewed with pt, and pt to continue medical treatment as before,  to f/u any worsening symptoms or concerns Lab Results  Component Value Date   LDLCALC 73 08/02/2018

## 2018-08-02 NOTE — Patient Instructions (Signed)
Please take all new medication as prescribed - the oxybutinin for bladder  You will be contacted regarding the referral for: Urology  Please continue all other medications as before, and refills have been done if requested.  Please have the pharmacy call with any other refills you may need.  Please continue your efforts at being more active, low cholesterol diet, and weight control.  You are otherwise up to date with prevention measures today.  Please keep your appointments with your specialists as you may have planned  Please go to the XRAY Department in the Basement (go straight as you get off the elevator) for the x-ray testing  Please go to the LAB in the Basement (turn left off the elevator) for the tests to be done today  You will be contacted by phone if any changes need to be made immediately.  Otherwise, you will receive a letter about your results with an explanation, but please check with MyChart first.  Please remember to sign up for MyChart if you have not done so, as this will be important to you in the future with finding out test results, communicating by private email, and scheduling acute appointments online when needed.  Please return in 6 months, or sooner if needed

## 2018-08-02 NOTE — Progress Notes (Signed)
Subjective:    Patient ID: Connor Morgan, male    DOB: September 06, 1936, 82 y.o.   MRN: [id number]  HPI    Here for yearly f/u;  Overall doing ok;  Pt denies worsening SOB, DOE, wheezing, orthopnea, PND, worsening LE edema, palpitations, dizziness or syncope.  Pt denies neurological change such as new headache, facial or extremity weakness.  Pt denies polydipsia, polyuria, or low sugar symptoms. Pt states overall good compliance with treatment and medications, good tolerability, and has been trying to follow appropriate diet.  Pt denies worsening depressive symptoms, suicidal ideation or panic. No fever, night sweats, wt loss, loss of appetite, or other constitutional symptoms.  Pt states good ability with ADL's, has low fall risk, home safety reviewed and adequate, no other significant changes in hearing or vision, and only occasionally active with exercise.  Does c/o pain to right sternoclavicular joint with occasional pain on swallowing, had EGD normal aug 2017.  Also has urinary urgency at times, has to keep urinal by the bedside, as well as frequency, and occasional pain, has hx of renal stone.  Denies urinary symptoms such as flank pain, hematuria or n/v, fever, chills, but has been dark. Past Medical History:  Diagnosis Date  . ALLERGIC RHINITIS 10/19/2007  . ALLERGY 08/13/2007  . BENIGN PROSTATIC HYPERTROPHY 10/19/2007  . CHEST PAIN, ATYPICAL 12/03/2008  . DYSPNEA 11/20/2008  . ERECTILE DYSFUNCTION 10/19/2007  . FATIGUE 10/19/2007  . FREQUENCY, URINARY 11/20/2008  . Gastric ulcer   . GERD 10/19/2007  . GLUCOSE INTOLERANCE 10/19/2007  . HEMORRHOIDS, HX OF 08/13/2007  . HYPERLIPIDEMIA 10/19/2007  . HYPERTENSION 08/13/2007  . Impaired glucose tolerance 07/08/2011  . NEPHROLITHIASIS 08/13/2007  . NEPHROLITHIASIS, HX OF 10/19/2007  . ORCHIECTOMY, HX OF 10/19/2007  . Spermatocele 08/13/2007   Past Surgical History:  Procedure Laterality Date  . INGUINAL HERNIA REPAIR Bilateral   . s/p right  orchiectomy     benign mass, right  . SKIN LESION EXCISION     neck  . TONSILLECTOMY      reports that he has quit smoking. He has never used smokeless tobacco. He reports that he does not drink alcohol or use drugs. family history includes Dementia in his father and mother; Heart disease in his brother and father; Kidney disease in his mother. Allergies  Allergen Reactions  . Mevacor [Lovastatin]     REACTION: myalgias   Current Outpatient Medications on File Prior to Visit  Medication Sig Dispense Refill  . amLODipine (NORVASC) 5 MG tablet Take 1 tablet (5 mg total) by mouth daily. 90 tablet 2  . aspirin 81 MG tablet Take 81 mg by mouth daily.      Marland Kitchen atorvastatin (LIPITOR) 10 MG tablet Take 1 tablet (10 mg total) by mouth daily. 90 tablet 3  . clotrimazole-betamethasone (LOTRISONE) cream Apply 1 application topically 2 (two) times daily. 30 g 0   No current facility-administered medications on file prior to visit.    Review of Systems  Constitutional: Negative for other unusual diaphoresis or sweats HENT: Negative for ear discharge or swelling Eyes: Negative for other worsening visual disturbances Respiratory: Negative for stridor or other swelling  Gastrointestinal: Negative for worsening distension or other blood Genitourinary: Negative for retention or other urinary change Musculoskeletal: Negative for other MSK pain or swelling Skin: Negative for color change or other new lesions Neurological: Negative for worsening tremors and other numbness  Psychiatric/Behavioral: Negative for worsening agitation or other fatigue All other system neg per pt  Objective:   Physical Exam BP 134/82   Pulse 70   Temp 97.8 F (36.6 C) (Oral)   Ht 5\' 11"  (1.803 m)   Wt 174 lb (78.9 kg)   SpO2 97%   BMI 24.27 kg/m  VS noted,  Constitutional: Pt appears in NAD HENT: Head: NCAT.  Right Ear: External ear normal.  Left Ear: External ear normal.  Eyes: . Pupils are equal, round, and  reactive to light. Conjunctivae and EOM are normal Nose: without d/c or deformity Neck: Neck supple. Gross normal ROM, has large sternoclavicular right swelling, nontender Cardiovascular: Normal rate and regular rhythm.   Pulmonary/Chest: Effort normal and breath sounds without rales or wheezing.  Abd:  Soft, NT, ND, + BS, no organomegaly Neurological: Pt is alert. At baseline orientation, motor grossly intact Skin: Skin is warm. No rashes, other new lesions, no LE edema Psychiatric: Pt behavior is normal without agitation  No other exam findings     Assessment & Plan:

## 2018-08-02 NOTE — Assessment & Plan Note (Signed)
stable overall by history and exam, recent data reviewed with pt, and pt to continue medical treatment as before,  to f/u any worsening symptoms or concerns BP Readings from Last 3 Encounters:  08/02/18 134/82  07/28/18 121/78  04/22/18 124/60

## 2018-08-02 NOTE — Assessment & Plan Note (Signed)
For uA with labs, ditropan xl 5 trial, and urology referral

## 2018-08-03 LAB — URINE CULTURE
MICRO NUMBER:: 91023239
RESULT: NO GROWTH
SPECIMEN QUALITY:: ADEQUATE

## 2018-10-09 DIAGNOSIS — Z23 Encounter for immunization: Secondary | ICD-10-CM | POA: Diagnosis not present

## 2019-01-23 DIAGNOSIS — L820 Inflamed seborrheic keratosis: Secondary | ICD-10-CM | POA: Diagnosis not present

## 2019-02-06 ENCOUNTER — Ambulatory Visit: Payer: Medicare Other | Admitting: Internal Medicine

## 2019-02-20 ENCOUNTER — Other Ambulatory Visit: Payer: Self-pay

## 2019-02-20 ENCOUNTER — Other Ambulatory Visit (INDEPENDENT_AMBULATORY_CARE_PROVIDER_SITE_OTHER): Payer: Medicare Other

## 2019-02-20 ENCOUNTER — Ambulatory Visit (INDEPENDENT_AMBULATORY_CARE_PROVIDER_SITE_OTHER): Payer: Medicare Other | Admitting: Internal Medicine

## 2019-02-20 ENCOUNTER — Encounter: Payer: Self-pay | Admitting: Internal Medicine

## 2019-02-20 VITALS — BP 134/88 | HR 63 | Temp 97.7°F | Ht 71.0 in | Wt 171.0 lb

## 2019-02-20 DIAGNOSIS — B356 Tinea cruris: Secondary | ICD-10-CM

## 2019-02-20 DIAGNOSIS — N3281 Overactive bladder: Secondary | ICD-10-CM

## 2019-02-20 DIAGNOSIS — R21 Rash and other nonspecific skin eruption: Secondary | ICD-10-CM | POA: Diagnosis not present

## 2019-02-20 DIAGNOSIS — I10 Essential (primary) hypertension: Secondary | ICD-10-CM | POA: Diagnosis not present

## 2019-02-20 DIAGNOSIS — R7302 Impaired glucose tolerance (oral): Secondary | ICD-10-CM | POA: Diagnosis not present

## 2019-02-20 LAB — URINALYSIS, ROUTINE W REFLEX MICROSCOPIC
Bilirubin Urine: NEGATIVE
Hgb urine dipstick: NEGATIVE
Ketones, ur: NEGATIVE
Leukocytes,Ua: NEGATIVE
NITRITE: NEGATIVE
RBC / HPF: NONE SEEN (ref 0–?)
Specific Gravity, Urine: 1.02 (ref 1.000–1.030)
Total Protein, Urine: NEGATIVE
Urine Glucose: NEGATIVE
Urobilinogen, UA: 0.2 (ref 0.0–1.0)
pH: 7 (ref 5.0–8.0)

## 2019-02-20 LAB — BASIC METABOLIC PANEL
BUN: 17 mg/dL (ref 6–23)
CO2: 29 mEq/L (ref 19–32)
Calcium: 9.4 mg/dL (ref 8.4–10.5)
Chloride: 104 mEq/L (ref 96–112)
Creatinine, Ser: 0.91 mg/dL (ref 0.40–1.50)
GFR: 79.96 mL/min (ref >60.00)
Glucose, Bld: 98 mg/dL (ref 70–99)
POTASSIUM: 4.1 meq/L (ref 3.5–5.1)
Sodium: 140 mEq/L (ref 135–145)

## 2019-02-20 LAB — CBC WITH DIFFERENTIAL/PLATELET
Basophils Absolute: 0.1 10*3/uL (ref 0.0–0.1)
Basophils Relative: 1.7 % (ref 0.0–3.0)
EOS ABS: 0.2 10*3/uL (ref 0.0–0.7)
Eosinophils Relative: 2.6 % (ref 0.0–5.0)
HCT: 42.3 % (ref 39.0–52.0)
Hemoglobin: 14.8 g/dL (ref 13.0–17.0)
Lymphocytes Relative: 23.6 % (ref 12.0–46.0)
Lymphs Abs: 1.4 10*3/uL (ref 0.7–4.0)
MCHC: 34.9 g/dL (ref 30.0–36.0)
MCV: 90.9 fl (ref 78.0–100.0)
MONO ABS: 0.4 10*3/uL (ref 0.1–1.0)
Monocytes Relative: 7.4 % (ref 3.0–12.0)
Neutro Abs: 3.9 10*3/uL (ref 1.4–7.7)
Neutrophils Relative %: 64.7 % (ref 43.0–77.0)
Platelets: 188 10*3/uL (ref 150.0–400.0)
RBC: 4.65 Mil/uL (ref 4.22–5.81)
RDW: 13.4 % (ref 11.5–15.5)
WBC: 6 10*3/uL (ref 4.0–10.5)

## 2019-02-20 LAB — LIPID PANEL
Cholesterol: 194 mg/dL (ref 0–200)
HDL: 53.1 mg/dL (ref >39.00)
LDL Cholesterol: 116 mg/dL — ABNORMAL HIGH (ref 0–99)
NonHDL: 141.31
Total CHOL/HDL Ratio: 4
Triglycerides: 127 mg/dL (ref 0.0–149.0)
VLDL: 25.4 mg/dL (ref 0.0–40.0)

## 2019-02-20 LAB — HEPATIC FUNCTION PANEL
ALT: 15 U/L (ref 0–53)
AST: 18 U/L (ref 0–37)
Albumin: 4.4 g/dL (ref 3.5–5.2)
Alkaline Phosphatase: 103 U/L (ref 39–117)
BILIRUBIN DIRECT: 0.1 mg/dL (ref 0.0–0.3)
Total Bilirubin: 0.5 mg/dL (ref 0.2–1.2)
Total Protein: 6.9 g/dL (ref 6.0–8.3)

## 2019-02-20 LAB — HEMOGLOBIN A1C: Hgb A1c MFr Bld: 5 % (ref 4.6–6.5)

## 2019-02-20 LAB — TSH: TSH: 1.81 u[IU]/mL (ref 0.35–4.50)

## 2019-02-20 MED ORDER — OXYBUTYNIN CHLORIDE ER 5 MG PO TB24: 5.0000 mg | ORAL_TABLET | Freq: Every day | ORAL | 3 refills | Status: DC

## 2019-02-20 MED ORDER — CLOTRIMAZOLE-BETAMETHASONE 1-0.05 % EX CREA: 1.0000 "application " | TOPICAL_CREAM | Freq: Two times a day (BID) | CUTANEOUS | 0 refills | Status: DC

## 2019-02-20 NOTE — Patient Instructions (Signed)
Please take all new medication as prescribed - the cream for the rash  Please continue all other medications as before, and refills have been done if requested - the bladder medication  Please have the pharmacy call with any other refills you may need.  Please continue your efforts at being more active, low cholesterol diet, and weight control.  You are otherwise up to date with prevention measures today.  Please keep your appointments with your specialists as you may have planned  Please go to the LAB in the Basement (turn left off the elevator) for the tests to be done today  You will be contacted by phone if any changes need to be made immediately.  Otherwise, you will receive a letter about your results with an explanation, but please check with MyChart first.  Please remember to sign up for MyChart if you have not done so, as this will be important to you in the future with finding out test results, communicating by private email, and scheduling acute appointments online when needed.  Please return in 6 months, or sooner if needed

## 2019-02-20 NOTE — Assessment & Plan Note (Signed)
stable overall by history and exam, recent data reviewed with pt, and pt to continue medical treatment as before,  to f/u any worsening symptoms or concerns  

## 2019-02-20 NOTE — Assessment & Plan Note (Signed)
With worsening symptoms, to restart the ditropan XL

## 2019-02-20 NOTE — Progress Notes (Signed)
Subjective:    Patient ID: Connor Morgan, male    DOB: [birth year], 83 y.o.   MRN: [id number]  HPI  Here to f/u; overall doing ok,  Pt denies chest pain, increasing sob or doe, wheezing, orthopnea, PND, increased LE swelling, palpitations, dizziness or syncope.  Pt denies new neurological symptoms such as new headache, or facial or extremity weakness or numbness.  Pt denies polydipsia, polyuria, or low sugar episode.  Pt states overall good compliance with meds, mostly trying to follow appropriate diet, with wt overall stable,  but little exercise however.  Bp at home somewhat labile.  Has itchy red rash getting larger kind of oval, non raised non tender nonscaly  Denies urinary symptoms such as dysuria,, urgency, flank pain, hematuria or n/v, fever, chills, but often urinates every 2 hrs having run out of his ditropan. Wt Readings from Last 3 Encounters:  02/20/19 171 lb (77.6 kg)  08/02/18 174 lb (78.9 kg)  07/28/18 171 lb (77.6 kg)   BP Readings from Last 3 Encounters:  02/20/19 134/88  08/02/18 134/82  07/28/18 121/78   Past Medical History:  Diagnosis Date  . ALLERGIC RHINITIS 10/19/2007  . ALLERGY 08/13/2007  . BENIGN PROSTATIC HYPERTROPHY 10/19/2007  . CHEST PAIN, ATYPICAL 12/03/2008  . DYSPNEA 11/20/2008  . ERECTILE DYSFUNCTION 10/19/2007  . FATIGUE 10/19/2007  . FREQUENCY, URINARY 11/20/2008  . Gastric ulcer   . GERD 10/19/2007  . GLUCOSE INTOLERANCE 10/19/2007  . HEMORRHOIDS, HX OF 08/13/2007  . HYPERLIPIDEMIA 10/19/2007  . HYPERTENSION 08/13/2007  . Impaired glucose tolerance 07/08/2011  . NEPHROLITHIASIS 08/13/2007  . NEPHROLITHIASIS, HX OF 10/19/2007  . ORCHIECTOMY, HX OF 10/19/2007  . Spermatocele 08/13/2007   Past Surgical History:  Procedure Laterality Date  . INGUINAL HERNIA REPAIR Bilateral   . s/p right orchiectomy     benign mass, right  . SKIN LESION EXCISION     neck  . TONSILLECTOMY      reports that he has quit smoking. He has never used smokeless tobacco.  He reports that he does not drink alcohol or use drugs. family history includes Dementia in his father and mother; Heart disease in his brother and father; Kidney disease in his mother. Allergies  Allergen Reactions  . Mevacor [Lovastatin]     REACTION: myalgias   Current Outpatient Medications on File Prior to Visit  Medication Sig Dispense Refill  . amLODipine (NORVASC) 5 MG tablet Take 1 tablet (5 mg total) by mouth daily. 90 tablet 2  . aspirin 81 MG tablet Take 81 mg by mouth daily.      Marland Kitchen atorvastatin (LIPITOR) 10 MG tablet Take 1 tablet (10 mg total) by mouth daily. 90 tablet 3   No current facility-administered medications on file prior to visit.    Review of Systems  Constitutional: Negative for other unusual diaphoresis or sweats HENT: Negative for ear discharge or swelling Eyes: Negative for other worsening visual disturbances Respiratory: Negative for stridor or other swelling  Gastrointestinal: Negative for worsening distension or other blood Genitourinary: Negative for retention or other urinary change Musculoskeletal: Negative for other MSK pain or swelling Skin: Negative for color change or other new lesions Neurological: Negative for worsening tremors and other numbness  Psychiatric/Behavioral: Negative for worsening agitation or other fatigue All other system neg per pt    Objective:   Physical Exam BP 134/88   Pulse 63   Temp 97.7 F (36.5 C) (Oral)   Ht 5\' 11"  (1.803 m)   Wt 171  lb (77.6 kg)   SpO2 98%   BMI 23.85 kg/m  VS noted,  Constitutional: Pt appears in NAD HENT: Head: NCAT.  Right Ear: External ear normal.  Left Ear: External ear normal.  Eyes: . Pupils are equal, round, and reactive to light. Conjunctivae and EOM are normal Nose: without d/c or deformity Neck: Neck supple. Gross normal ROM Cardiovascular: Normal rate and regular rhythm.   Pulmonary/Chest: Effort normal and breath sounds without rales or wheezing.  Abd:  Soft, NT, ND, + BS,  no organomegaly Neurological: Pt is alert. At baseline orientation, motor grossly intact Skin: Skin is warm. No other new lesions, no LE edema, + erythem ringworm type rash approx 1.5 cm nontender left medial distal leg Psychiatric: Pt behavior is normal without agitation  No other exam findings  Lab Results  Component Value Date   WBC 6.9 08/02/2018   HGB 14.4 08/02/2018   HCT 40.5 08/02/2018   PLT 182.0 08/02/2018   GLUCOSE 80 08/02/2018   CHOL 146 08/02/2018   TRIG 114.0 08/02/2018   HDL 50.80 08/02/2018   LDLDIRECT 128.8 10/19/2007   LDLCALC 73 08/02/2018   ALT 17 08/02/2018   AST 17 08/02/2018   NA 141 08/02/2018   K 4.0 08/02/2018   CL 105 08/02/2018   CREATININE 0.84 08/02/2018   BUN 13 08/02/2018   CO2 31 08/02/2018   TSH 2.09 08/02/2018   PSA 1.96 09/21/2017   HGBA1C 5.2 09/21/2017        Assessment & Plan:

## 2019-02-20 NOTE — Assessment & Plan Note (Signed)
To left medial leg, c/w likely ringworn, for lotrisone asd,  to f/u any worsening symptoms or concerns

## 2019-03-27 ENCOUNTER — Other Ambulatory Visit: Payer: Self-pay | Admitting: Internal Medicine

## 2019-03-28 ENCOUNTER — Other Ambulatory Visit: Payer: Self-pay | Admitting: Internal Medicine

## 2019-06-26 ENCOUNTER — Other Ambulatory Visit: Payer: Self-pay | Admitting: Internal Medicine

## 2019-08-02 ENCOUNTER — Encounter: Payer: Self-pay | Admitting: Internal Medicine

## 2019-08-02 NOTE — Progress Notes (Deleted)
Subjective:   Connor Morgan is a 83 y.o. male who presents for Medicare Annual/Subsequent preventive examination.  Review of Systems:     Sleep patterns: {SX; SLEEP PATTERNS:18802::"feels rested on waking","does not get up to void","gets up *** times nightly to void","sleeps *** hours nightly"}.    Home Safety/Smoke Alarms: Feels safe in home. Smoke alarms in place.  Living environment; residence and Firearm Safety: {Rehab home environment / accessibility:30080::"no firearms","firearms stored safely"}. Seat Belt Safety/Bike Helmet: Wears seat belt.       Objective:    Vitals: There were no vitals taken for this visit.  There is no height or weight on file to calculate BMI.  Advanced Directives 07/28/2018 03/11/2017 05/01/2016  Does Patient Have a Medical Advance Directive? Yes Yes Yes  Type of Paramedic of [place];Living will [place];Living will -  Copy of [place] in Chart? No - copy requested No - copy requested No - copy requested    Tobacco Social History   Tobacco Use  Smoking Status Former Smoker  Smokeless Tobacco Never Used     Counseling given: Not Answered   Past Medical History:  Diagnosis Date  . ALLERGIC RHINITIS 10/19/2007  . ALLERGY 08/13/2007  . BENIGN PROSTATIC HYPERTROPHY 10/19/2007  . CHEST PAIN, ATYPICAL 12/03/2008  . DYSPNEA 11/20/2008  . ERECTILE DYSFUNCTION 10/19/2007  . FATIGUE 10/19/2007  . FREQUENCY, URINARY 11/20/2008  . Gastric ulcer   . GERD 10/19/2007  . GLUCOSE INTOLERANCE 10/19/2007  . HEMORRHOIDS, HX OF 08/13/2007  . HYPERLIPIDEMIA 10/19/2007  . HYPERTENSION 08/13/2007  . Impaired glucose tolerance 07/08/2011  . NEPHROLITHIASIS 08/13/2007  . NEPHROLITHIASIS, HX OF 10/19/2007  . ORCHIECTOMY, HX OF 10/19/2007  . Spermatocele 08/13/2007   Past Surgical History:  Procedure Laterality Date  . INGUINAL HERNIA REPAIR Bilateral   . s/p right orchiectomy     benign mass, right   . SKIN LESION EXCISION     neck  . TONSILLECTOMY     Family History  Problem Relation Age of Onset  . Dementia Mother   . Kidney disease Mother   . Heart disease Father   . Dementia Father   . Heart disease Brother        x 2   Social History   Socioeconomic History  . Marital status: Married    Spouse name: Not on file  . Number of children: 5  . Years of education: Not on file  . Highest education level: Not on file  Occupational History  . Occupation: previously worked in Personal assistant and Cabin crew  Social Needs  . Financial resource strain: Not very hard  . Food insecurity    Worry: Never true    Inability: Never true  . Transportation needs    Medical: No    Non-medical: No  Tobacco Use  . Smoking status: Former Research scientist (life sciences)  . Smokeless tobacco: Never Used  Substance and Sexual Activity  . Alcohol use: No    Alcohol/week: 0.0 standard drinks  . Drug use: No  . Sexual activity: Not Currently  Lifestyle  . Physical activity    Days per week: 5 days    Minutes per session: 50 min  . Stress: Not at all  Relationships  . Social connections    Talks on phone: More than three times a week    Gets together: More than three times a week    Attends religious service: Never    Active member of  club or organization: Yes    Attends meetings of clubs or organizations: More than 4 times per year    Relationship status: Married  Other Topics Concern  . Not on file  Social History Narrative  . Not on file    Outpatient Encounter Medications as of 08/03/2019  Medication Sig  . amLODipine (NORVASC) 5 MG tablet Take 1 tablet by mouth once daily  . aspirin 81 MG tablet Take 81 mg by mouth daily.    Marland Kitchen atorvastatin (LIPITOR) 10 MG tablet Take 1 tablet by mouth once daily  . clotrimazole-betamethasone (LOTRISONE) cream Apply 1 application topically 2 (two) times daily.  Marland Kitchen oxybutynin (DITROPAN-XL) 5 MG 24 hr tablet Take 1 tablet (5 mg total) by mouth at bedtime.   No  facility-administered encounter medications on file as of 08/03/2019.     Activities of Daily Living No flowsheet data found.  Patient Care Team: Biagio Borg, MD as PCP - General   Assessment:   This is a routine wellness examination for [place]. Physical assessment deferred to PCP.  Exercise Activities and Dietary recommendations   Diet (meal preparation, eat out, water intake, caffeinated beverages, dairy products, fruits and vegetables): {Desc; diets:16563}   Goals    . Maintain current health status     Continue to be healthy, eat healthy, and enjoy life.    . Patient Stated     Stay as active physically and socially as possible. Enjoy life and go to the beach often.        Fall Risk Fall Risk  02/20/2019 07/28/2018 04/22/2018 09/21/2017 03/11/2017  Falls in the past year? 0 No No No No   Depression Screen PHQ 2/9 Scores 02/20/2019 07/28/2018 04/22/2018 09/21/2017  PHQ - 2 Score 0 0 0 0    Cognitive Function MMSE - Mini Mental State Exam 07/28/2018  Orientation to time 5  Orientation to Place 5  Registration 3  Attention/ Calculation 5  Recall 2  Language- name 2 objects 2  Language- repeat 1  Language- follow 3 step command 3  Language- read & follow direction 1  Write a sentence 1  Copy design 1  Total score 29        Immunization History  Administered Date(s) Administered  . Influenza Whole 10/19/2007, 10/09/2008  . Influenza, High Dose Seasonal PF 09/21/2017  . Influenza,inj,Quad PF,6+ Mos 12/22/2013  . Pneumococcal Conjugate-13 01/05/2014  . Pneumococcal Polysaccharide-23 10/19/2007  . Td 12/07/1994, 11/20/2008  . Tdap 05/26/2016   Screening Tests Health Maintenance  Topic Date Due  . INFLUENZA VACCINE  07/08/2019  . TETANUS/TDAP  05/26/2026  . PNA vac Low Risk Adult  Completed       Plan:     I have personally reviewed and noted the following in the patient's chart:   . Medical and social history . Use of alcohol, tobacco or illicit drugs   . Current medications and supplements . Functional ability and status . Nutritional status . Physical activity . Advanced directives . List of other physicians . Vitals . Screenings to include cognitive, depression, and falls . Referrals and appointments  In addition, I have reviewed and discussed with patient certain preventive protocols, quality metrics, and best practice recommendations. A written personalized care plan for preventive services as well as general preventive health recommendations were provided to patient.     Michiel Cowboy, RN  08/02/2019

## 2019-08-03 ENCOUNTER — Ambulatory Visit: Payer: Medicare Other

## 2019-08-23 ENCOUNTER — Ambulatory Visit: Payer: Medicare Other | Admitting: Internal Medicine

## 2019-08-30 ENCOUNTER — Ambulatory Visit: Payer: Medicare Other

## 2019-09-12 ENCOUNTER — Encounter: Payer: Self-pay | Admitting: Internal Medicine

## 2019-09-12 ENCOUNTER — Ambulatory Visit (INDEPENDENT_AMBULATORY_CARE_PROVIDER_SITE_OTHER): Payer: Medicare Other | Admitting: Internal Medicine

## 2019-09-12 ENCOUNTER — Other Ambulatory Visit: Payer: Self-pay

## 2019-09-12 VITALS — BP 142/78 | HR 68 | Temp 97.7°F | Ht 71.0 in | Wt 170.0 lb

## 2019-09-12 DIAGNOSIS — R21 Rash and other nonspecific skin eruption: Secondary | ICD-10-CM | POA: Diagnosis not present

## 2019-09-12 DIAGNOSIS — Z23 Encounter for immunization: Secondary | ICD-10-CM

## 2019-09-12 MED ORDER — TRIAMCINOLONE ACETONIDE 0.1 % EX CREA: 1.0000 "application " | TOPICAL_CREAM | Freq: Two times a day (BID) | CUTANEOUS | 0 refills | Status: DC

## 2019-09-12 NOTE — Progress Notes (Addendum)
Subjective:   Connor Morgan is a 83 y.o. male who presents for Medicare Annual/Subsequent preventive examination. I connected with patient by a telephone and verified that I am speaking with the correct person using two identifiers. Patient stated full name and DOB. Patient gave permission to continue with telephonic visit. Patient's location was at home and Nurse's location was at [place] office.   Review of Systems:     Sleep patterns: gets up 3-4 times nightly to void and sleeps 6 hours nightly.    Home Safety/Smoke Alarms: Feels safe in home. Smoke alarms in place.  Living environment; residence and Firearm Safety: 1-story house/ trailer. Lives with wife, no needs for DME, good support system Seat Belt Safety/Bike Helmet: Wears seat belt.     Objective:    Vitals: There were no vitals taken for this visit.  There is no height or weight on file to calculate BMI.  Advanced Directives 09/13/2019 07/28/2018 03/11/2017 05/01/2016  Does Patient Have a Medical Advance Directive? Yes Yes Yes Yes  Type of Paramedic of [place];Living will [place];Living will Connor Morgan;Living will -  Copy of [place] in Chart? No - copy requested No - copy requested No - copy requested No - copy requested    Tobacco Social History   Tobacco Use  Smoking Status Former Smoker  Smokeless Tobacco Never Used     Counseling given: Not Answered  Past Medical History:  Diagnosis Date  . ALLERGIC RHINITIS 10/19/2007  . ALLERGY 08/13/2007  . BENIGN PROSTATIC HYPERTROPHY 10/19/2007  . CHEST PAIN, ATYPICAL 12/03/2008  . DYSPNEA 11/20/2008  . ERECTILE DYSFUNCTION 10/19/2007  . FATIGUE 10/19/2007  . FREQUENCY, URINARY 11/20/2008  . Gastric ulcer   . GERD 10/19/2007  . GLUCOSE INTOLERANCE 10/19/2007  . HEMORRHOIDS, HX OF 08/13/2007  . HYPERLIPIDEMIA 10/19/2007  . HYPERTENSION 08/13/2007  . Impaired glucose tolerance 07/08/2011   . NEPHROLITHIASIS 08/13/2007  . NEPHROLITHIASIS, HX OF 10/19/2007  . ORCHIECTOMY, HX OF 10/19/2007  . Spermatocele 08/13/2007   Past Surgical History:  Procedure Laterality Date  . INGUINAL HERNIA REPAIR Bilateral   . s/p right orchiectomy     benign mass, right  . SKIN LESION EXCISION     neck  . TONSILLECTOMY     Family History  Problem Relation Age of Onset  . Dementia Mother   . Kidney disease Mother   . Heart disease Father   . Dementia Father   . Heart disease Brother        x 2   Social History   Socioeconomic History  . Marital status: Married    Spouse name: Not on file  . Number of children: 5  . Years of education: Not on file  . Highest education level: Not on file  Occupational History  . Occupation: previously worked in Personal assistant and Cabin crew  Social Needs  . Financial resource strain: Not hard at all  . Food insecurity    Worry: Never true    Inability: Never true  . Transportation needs    Medical: No    Non-medical: No  Tobacco Use  . Smoking status: Former Research scientist (life sciences)  . Smokeless tobacco: Never Used  Substance and Sexual Activity  . Alcohol use: No    Alcohol/week: 0.0 standard drinks  . Drug use: No  . Sexual activity: Not Currently  Lifestyle  . Physical activity    Days per week: 5 days  Minutes per session: 50 min  . Stress: Not at all  Relationships  . Social connections    Talks on phone: More than three times a week    Gets together: More than three times a week    Attends religious service: Never    Active member of club or organization: Yes    Attends meetings of clubs or organizations: More than 4 times per year    Relationship status: Married  Other Topics Concern  . Not on file  Social History Narrative  . Not on file    Outpatient Encounter Medications as of 09/13/2019  Medication Sig  . amLODipine (NORVASC) 5 MG tablet Take 1 tablet by mouth once daily  . aspirin 81 MG tablet Take 81 mg by mouth daily.    Marland Kitchen  atorvastatin (LIPITOR) 10 MG tablet Take 1 tablet by mouth once daily  . oxybutynin (DITROPAN-XL) 5 MG 24 hr tablet Take 1 tablet (5 mg total) by mouth at bedtime.  . triamcinolone cream (KENALOG) 0.1 % Apply 1 application topically 2 (two) times daily.  . [DISCONTINUED] clotrimazole-betamethasone (LOTRISONE) cream Apply 1 application topically 2 (two) times daily.   No facility-administered encounter medications on file as of 09/13/2019.     Activities of Daily Living In your present state of health, do you have any difficulty performing the following activities: 09/13/2019  Hearing? N  Vision? N  Difficulty concentrating or making decisions? N  Walking or climbing stairs? N  Dressing or bathing? N  Doing errands, shopping? N  Preparing Food and eating ? N  Using the Toilet? N  In the past six months, have you accidently leaked urine? N  Do you have problems with loss of bowel control? N  Managing your Medications? N  Managing your Finances? N  Housekeeping or managing your Housekeeping? N  Some recent data might be hidden    Patient Care Team: Biagio Borg, MD as PCP - General   Assessment:   This is a routine wellness examination for [name]. Physical assessment deferred to PCP.   Exercise Activities and Dietary recommendations Current Exercise Habits: The patient does not participate in regular exercise at present  Diet (meal preparation, eat out, water intake, caffeinated beverages, dairy products, fruits and vegetables): in general, a "healthy" diet  , well balanced   Reviewed heart healthy diet.  Encouraged patient to increase daily water and healthy fluid intake.  Goals    . Maintain current health status     Continue to be healthy, eat healthy, and enjoy life.    . Patient Stated     Stay as active physically and socially as possible. Enjoy life and go to the beach often.        Fall Risk Fall Risk  09/13/2019 02/20/2019 07/28/2018 04/22/2018 09/21/2017  Falls  in the past year? 0 0 No No No  Number falls in past yr: 0 - - - -  Injury with Fall? 0 - - - -  Follow up Falls prevention discussed - - - -   Is the patient's home free of loose throw rugs in walkways, pet beds, electrical cords, etc?   yes      Grab bars in the bathroom? yes      Handrails on the stairs?   yes      Adequate lighting?   yes  Depression Screen PHQ 2/9 Scores 09/13/2019 02/20/2019 07/28/2018 04/22/2018  PHQ - 2 Score 0 0 0 0    Cognitive  Function MMSE - Mini Mental State Exam 07/28/2018  Orientation to time 5  Orientation to Place 5  Registration 3  Attention/ Calculation 5  Recall 2  Language- name 2 objects 2  Language- repeat 1  Language- follow 3 step command 3  Language- read & follow direction 1  Write a sentence 1  Copy design 1  Total score 29     6CIT Screen 09/13/2019  What Year? 0 points  What month? 0 points  What time? 0 points  Count back from 20 0 points  Months in reverse 0 points  Repeat phrase 0 points  Total Score 0    Immunization History  Administered Date(s) Administered  . Fluad Quad(high Dose 65+) 09/12/2019  . Influenza Whole 10/19/2007, 10/09/2008  . Influenza, High Dose Seasonal PF 09/21/2017  . Influenza,inj,Quad PF,6+ Mos 12/22/2013  . Pneumococcal Conjugate-13 01/05/2014  . Pneumococcal Polysaccharide-23 10/19/2007  . Td 12/07/1994, 11/20/2008  . Tdap 05/26/2016   Screening Tests Health Maintenance  Topic Date Due  . TETANUS/TDAP  05/26/2026  . INFLUENZA VACCINE  Completed  . PNA vac Low Risk Adult  Completed       Plan:    Reviewed health maintenance screenings with patient today and relevant education, vaccines, and/or referrals were provided.   Continue to eat heart healthy diet (full of fruits, vegetables, whole grains, lean protein, water--limit salt, fat, and sugar intake) and increase physical activity as tolerated.  Continue doing brain stimulating activities (puzzles, reading, adult coloring books,  staying active) to keep memory sharp.   I have personally reviewed and noted the following in the patient's chart:   . Medical and social history . Use of alcohol, tobacco or illicit drugs  . Current medications and supplements . Functional ability and status . Nutritional status . Physical activity . Advanced directives . List of other physicians . Screenings to include cognitive, depression, and falls . Referrals and appointments  In addition, I have reviewed and discussed with patient certain preventive protocols, quality metrics, and best practice recommendations. A written personalized care plan for preventive services as well as general preventive health recommendations were provided to patient.     Michiel Cowboy, RN  09/13/2019  Medical screening examination/treatment/procedure(s) were performed by non-physician practitioner and as supervising physician I was immediately available for consultation/collaboration. I agree with above. Cathlean Cower, MD

## 2019-09-12 NOTE — Assessment & Plan Note (Signed)
Rx triamcinolone ointment to use on area.

## 2019-09-12 NOTE — Patient Instructions (Signed)
We have sent in triamcinolone to use twice a day on these spots and they should go away in about a week or so.

## 2019-09-12 NOTE — Progress Notes (Signed)
   Subjective:   Patient ID: Connor Morgan, male    DOB: 12/13/1935, 83 y.o.   MRN: [id number]  HPI The patient is an 83 YO man coming in for concern about rash on legs. Noticed Saturday some bites on legs. Washes all clothes and linens in case there were bugs. Is having pain and itching in the area. Used an old cream which did not do anything. Overall seems to be stable but a new lesion started this morning. Denies others in the household with similar lesions. Denies fevers or chills. Denies ticks.   Review of Systems  Constitutional: Negative.   HENT: Negative.   Eyes: Negative.   Respiratory: Negative for cough, chest tightness and shortness of breath.   Cardiovascular: Negative for chest pain, palpitations and leg swelling.  Gastrointestinal: Negative for abdominal distention, abdominal pain, constipation, diarrhea, nausea and vomiting.  Musculoskeletal: Negative.   Skin: Positive for rash.  Neurological: Negative.   Psychiatric/Behavioral: Negative.     Objective:  Physical Exam Constitutional:      Appearance: He is well-developed.  HENT:     Head: Normocephalic and atraumatic.  Neck:     Musculoskeletal: Normal range of motion.  Cardiovascular:     Rate and Rhythm: Normal rate and regular rhythm.  Pulmonary:     Effort: Pulmonary effort is normal. No respiratory distress.     Breath sounds: Normal breath sounds. No wheezing or rales.  Abdominal:     General: Bowel sounds are normal. There is no distension.     Palpations: Abdomen is soft.     Tenderness: There is no abdominal tenderness. There is no rebound.  Skin:    General: Skin is warm and dry.     Findings: Rash present.     Comments: 3-4 lesions, red with central scab without surrounding erythema and no target lesions.   Neurological:     Mental Status: He is alert and oriented to person, place, and time.     Coordination: Coordination normal.     Vitals:   09/12/19 1337  BP: (!) 142/78  Pulse: 68   Temp: 97.7 F (36.5 C)  TempSrc: Oral  SpO2: 97%  Weight: 170 lb (77.1 kg)  Height: 5\' 11"  (1.803 m)    Assessment & Plan:  Flu shot given at visit

## 2019-09-13 ENCOUNTER — Ambulatory Visit (INDEPENDENT_AMBULATORY_CARE_PROVIDER_SITE_OTHER): Payer: Medicare Other | Admitting: *Deleted

## 2019-09-13 DIAGNOSIS — Z0000 Encounter for general adult medical examination without abnormal findings: Secondary | ICD-10-CM | POA: Diagnosis not present

## 2019-10-24 ENCOUNTER — Other Ambulatory Visit: Payer: Self-pay | Admitting: Internal Medicine

## 2020-01-29 ENCOUNTER — Other Ambulatory Visit: Payer: Self-pay | Admitting: Internal Medicine

## 2020-01-29 NOTE — Telephone Encounter (Signed)
Please refill as per office routine med refill policy (all routine meds refilled for 3 mo or monthly per pt preference up to one year from last visit, then month to month grace period for 3 mo, then further med refills will have to be denied)  

## 2020-04-03 ENCOUNTER — Telehealth: Payer: Self-pay | Admitting: Internal Medicine

## 2020-04-05 NOTE — Telephone Encounter (Signed)
   Patient has no medication remaining,appointment scheduled for 5/3

## 2020-04-08 ENCOUNTER — Other Ambulatory Visit: Payer: Self-pay

## 2020-04-09 ENCOUNTER — Ambulatory Visit (INDEPENDENT_AMBULATORY_CARE_PROVIDER_SITE_OTHER): Payer: Medicare Other | Admitting: Internal Medicine

## 2020-04-09 ENCOUNTER — Ambulatory Visit (INDEPENDENT_AMBULATORY_CARE_PROVIDER_SITE_OTHER): Payer: Medicare Other

## 2020-04-09 ENCOUNTER — Other Ambulatory Visit: Payer: Self-pay

## 2020-04-09 ENCOUNTER — Encounter: Payer: Self-pay | Admitting: Internal Medicine

## 2020-04-09 ENCOUNTER — Other Ambulatory Visit: Payer: Self-pay | Admitting: Internal Medicine

## 2020-04-09 VITALS — BP 150/80 | HR 73 | Temp 98.6°F | Ht 71.0 in | Wt 173.0 lb

## 2020-04-09 DIAGNOSIS — R7302 Impaired glucose tolerance (oral): Secondary | ICD-10-CM

## 2020-04-09 DIAGNOSIS — N200 Calculus of kidney: Secondary | ICD-10-CM | POA: Diagnosis not present

## 2020-04-09 DIAGNOSIS — I10 Essential (primary) hypertension: Secondary | ICD-10-CM | POA: Diagnosis not present

## 2020-04-09 DIAGNOSIS — R109 Unspecified abdominal pain: Secondary | ICD-10-CM | POA: Insufficient documentation

## 2020-04-09 DIAGNOSIS — H6010 Cellulitis of external ear, unspecified ear: Secondary | ICD-10-CM | POA: Insufficient documentation

## 2020-04-09 DIAGNOSIS — H6012 Cellulitis of left external ear: Secondary | ICD-10-CM | POA: Diagnosis not present

## 2020-04-09 DIAGNOSIS — E785 Hyperlipidemia, unspecified: Secondary | ICD-10-CM

## 2020-04-09 DIAGNOSIS — N202 Calculus of kidney with calculus of ureter: Secondary | ICD-10-CM | POA: Diagnosis not present

## 2020-04-09 DIAGNOSIS — E538 Deficiency of other specified B group vitamins: Secondary | ICD-10-CM

## 2020-04-09 DIAGNOSIS — E559 Vitamin D deficiency, unspecified: Secondary | ICD-10-CM

## 2020-04-09 DIAGNOSIS — R10A2 Flank pain, left side: Secondary | ICD-10-CM | POA: Insufficient documentation

## 2020-04-09 LAB — BASIC METABOLIC PANEL
BUN: 17 mg/dL (ref 6–23)
CO2: 29 mEq/L (ref 19–32)
Calcium: 9.3 mg/dL (ref 8.4–10.5)
Chloride: 105 mEq/L (ref 96–112)
Creatinine, Ser: 0.85 mg/dL (ref 0.40–1.50)
GFR: 86.27 mL/min (ref >60.00)
Glucose, Bld: 89 mg/dL (ref 70–99)
Potassium: 3.8 mEq/L (ref 3.5–5.1)
Sodium: 140 mEq/L (ref 135–145)

## 2020-04-09 LAB — URINALYSIS, ROUTINE W REFLEX MICROSCOPIC
Bilirubin Urine: NEGATIVE
Ketones, ur: NEGATIVE
Leukocytes,Ua: NEGATIVE
Nitrite: NEGATIVE
Specific Gravity, Urine: 1.015 (ref 1.000–1.030)
Total Protein, Urine: NEGATIVE
Urine Glucose: NEGATIVE
Urobilinogen, UA: 1 (ref 0.0–1.0)
pH: 7.5 (ref 5.0–8.0)

## 2020-04-09 LAB — HEPATIC FUNCTION PANEL
ALT: 14 U/L (ref 0–53)
AST: 18 U/L (ref 0–37)
Albumin: 4.6 g/dL (ref 3.5–5.2)
Alkaline Phosphatase: 104 U/L (ref 39–117)
Bilirubin, Direct: 0.1 mg/dL (ref 0.0–0.3)
Total Bilirubin: 0.7 mg/dL (ref 0.2–1.2)
Total Protein: 6.8 g/dL (ref 6.0–8.3)

## 2020-04-09 LAB — CBC WITH DIFFERENTIAL/PLATELET
Basophils Absolute: 0.2 10*3/uL — ABNORMAL HIGH (ref 0.0–0.1)
Basophils Relative: 2.4 % (ref 0.0–3.0)
Eosinophils Absolute: 0.1 10*3/uL (ref 0.0–0.7)
Eosinophils Relative: 2.3 % (ref 0.0–5.0)
HCT: 41.8 % (ref 39.0–52.0)
Hemoglobin: 14.5 g/dL (ref 13.0–17.0)
Lymphocytes Relative: 27.2 % (ref 12.0–46.0)
Lymphs Abs: 1.7 10*3/uL (ref 0.7–4.0)
MCHC: 34.8 g/dL (ref 30.0–36.0)
MCV: 90.7 fl (ref 78.0–100.0)
Monocytes Absolute: 0.6 10*3/uL (ref 0.1–1.0)
Monocytes Relative: 8.9 % (ref 3.0–12.0)
Neutro Abs: 3.7 10*3/uL (ref 1.4–7.7)
Neutrophils Relative %: 59.2 % (ref 43.0–77.0)
Platelets: 198 10*3/uL (ref 150.0–400.0)
RBC: 4.61 Mil/uL (ref 4.22–5.81)
RDW: 13.6 % (ref 11.5–15.5)
WBC: 6.3 10*3/uL (ref 4.0–10.5)

## 2020-04-09 LAB — LIPID PANEL
Cholesterol: 169 mg/dL (ref 0–200)
HDL: 57.2 mg/dL (ref >39.00)
LDL Cholesterol: 91 mg/dL (ref 0–99)
NonHDL: 111.83
Total CHOL/HDL Ratio: 3
Triglycerides: 106 mg/dL (ref 0.0–149.0)
VLDL: 21.2 mg/dL (ref 0.0–40.0)

## 2020-04-09 LAB — TSH: TSH: 1.77 u[IU]/mL (ref 0.35–4.50)

## 2020-04-09 LAB — HEMOGLOBIN A1C: Hgb A1c MFr Bld: 5 % (ref 4.6–6.5)

## 2020-04-09 LAB — VITAMIN D 25 HYDROXY (VIT D DEFICIENCY, FRACTURES): VITD: 22.49 ng/mL — ABNORMAL LOW (ref 30.00–100.00)

## 2020-04-09 LAB — VITAMIN B12: Vitamin B-12: 118 pg/mL — ABNORMAL LOW (ref 211–911)

## 2020-04-09 MED ORDER — OXYBUTYNIN CHLORIDE ER 5 MG PO TB24: 5.0000 mg | ORAL_TABLET | Freq: Every day | ORAL | 3 refills | Status: DC

## 2020-04-09 MED ORDER — VITAMIN B-12 1000 MCG PO TABS: 1000.0000 ug | ORAL_TABLET | Freq: Every day | ORAL | 3 refills | Status: AC

## 2020-04-09 MED ORDER — LEVOFLOXACIN 500 MG PO TABS: 500.0000 mg | ORAL_TABLET | Freq: Every day | ORAL | 0 refills | Status: AC

## 2020-04-09 MED ORDER — AMLODIPINE BESYLATE 5 MG PO TABS: 5.0000 mg | ORAL_TABLET | Freq: Every day | ORAL | 3 refills | Status: DC

## 2020-04-09 MED ORDER — ATORVASTATIN CALCIUM 10 MG PO TABS: 10.0000 mg | ORAL_TABLET | Freq: Every day | ORAL | 3 refills | Status: DC

## 2020-04-09 MED ORDER — VITAMIN D (ERGOCALCIFEROL) 1.25 MG (50000 UNIT) PO CAPS: 50000.0000 [IU] | ORAL_CAPSULE | ORAL | 0 refills | Status: DC

## 2020-04-09 NOTE — Patient Instructions (Addendum)
Please take all new medication as prescribed - the antibiotic  Please continue all other medications as before, and refills have been done if requested.  Please have the pharmacy call with any other refills you may need.  Please continue your efforts at being more active, low cholesterol diet, and weight control.  You are otherwise up to date with prevention measures today.  Please keep your appointments with your specialists as you may have planned  You will be contacted regarding the referral for: Urology  Please go to the XRAY Department in the first floor for the x-ray testing  Please go to the LAB at the blood drawing area for the tests to be done  You will be contacted by phone if any changes need to be made immediately.  Otherwise, you will receive a letter about your results with an explanation, but please check with MyChart first.  Please remember to sign up for MyChart if you have not done so, as this will be important to you in the future with finding out test results, communicating by private email, and scheduling acute appointments online when needed.  Please make an Appointment to return in 6 months, or sooner if needed

## 2020-04-09 NOTE — Progress Notes (Signed)
Subjective:    Patient ID: Connor Morgan, male    DOB: 08-26-1936, 84 y.o.   MRN: [id number]  HPI  Here with 3 das onset left tragus area red, tender, swelling without trauma, fever, HA, other ear pain or d/c.   Seen at [facility] with popping to the left ear, told to come here after audiology exam for hearing aids.   Sore to lie on left ear.  Also c/o left flank pain for 1 wk but Denies urinary symptoms such as dysuria, frequency, urgency, hematuria or n/v, fever, chills.  Has known kidney stone, asks for referral urology.  BP at home has been < 140/90 but not checked recentlyl.  Pt denies chest pain, increased sob or doe, wheezing, orthopnea, PND, increased LE swelling, palpitations, dizziness or syncope.  Pt denies new neurological symptoms such as new headache, or facial or extremity weakness or numbness   Pt denies polydipsia, polyuria Past Medical History:  Diagnosis Date  . ALLERGIC RHINITIS 10/19/2007  . ALLERGY 08/13/2007  . BENIGN PROSTATIC HYPERTROPHY 10/19/2007  . CHEST PAIN, ATYPICAL 12/03/2008  . DYSPNEA 11/20/2008  . ERECTILE DYSFUNCTION 10/19/2007  . FATIGUE 10/19/2007  . FREQUENCY, URINARY 11/20/2008  . Gastric ulcer   . GERD 10/19/2007  . GLUCOSE INTOLERANCE 10/19/2007  . HEMORRHOIDS, HX OF 08/13/2007  . HYPERLIPIDEMIA 10/19/2007  . HYPERTENSION 08/13/2007  . Impaired glucose tolerance 07/08/2011  . NEPHROLITHIASIS 08/13/2007  . NEPHROLITHIASIS, HX OF 10/19/2007  . ORCHIECTOMY, HX OF 10/19/2007  . Spermatocele 08/13/2007   Past Surgical History:  Procedure Laterality Date  . INGUINAL HERNIA REPAIR Bilateral   . s/p right orchiectomy     benign mass, right  . SKIN LESION EXCISION     neck  . TONSILLECTOMY      reports that he has quit smoking. He has never used smokeless tobacco. He reports that he does not drink alcohol or use drugs. family history includes Dementia in his father and mother; Heart disease in his brother and father; Kidney disease in his mother. Allergies    Allergen Reactions  . Mevacor [Lovastatin]     REACTION: myalgias   Current Outpatient Medications on File Prior to Visit  Medication Sig Dispense Refill  . aspirin 81 MG tablet Take 81 mg by mouth daily.      Marland Kitchen triamcinolone cream (KENALOG) 0.1 % Apply 1 application topically 2 (two) times daily. 100 g 0   No current facility-administered medications on file prior to visit.   Review of Systems All otherwise neg per pt    Objective:   Physical Exam BP (!) 150/80 (BP Location: Left Arm, Patient Position: Sitting, Cuff Size: Large)   Pulse 73   Temp 98.6 F (37 C) (Oral)   Ht 5\' 11"  (1.803 m)   Wt 173 lb (78.5 kg)   SpO2 97%   BMI 24.13 kg/m  VS noted,  Constitutional: Pt appears in NAD HENT: Head: NCAT.  Right Ear: External ear normal.  Left Ear: External ear normal. left tragus 2+ red, tender, swelling Eyes: . Pupils are equal, round, and reactive to light. Conjunctivae and EOM are normal Nose: without d/c or deformity Neck: Neck supple. Gross normal ROM Cardiovascular: Normal rate and regular rhythm.   Pulmonary/Chest: Effort normal and breath sounds without rales or wheezing.  Abd:  Soft, NT, ND, + BS, no organomegaly Neurological: Pt is alert. At baseline orientation, motor grossly intact Skin: Skin is warm. No rashes, other new lesions, no LE edema Psychiatric: Pt behavior  is normal without agitation  All otherwise neg per pt  Lab Results  Component Value Date   WBC 6.3 04/09/2020   HGB 14.5 04/09/2020   HCT 41.8 04/09/2020   PLT 198.0 04/09/2020   GLUCOSE 89 04/09/2020   CHOL 169 04/09/2020   TRIG 106.0 04/09/2020   HDL 57.20 04/09/2020   LDLDIRECT 128.8 10/19/2007   LDLCALC 91 04/09/2020   ALT 14 04/09/2020   AST 18 04/09/2020   NA 140 04/09/2020   K 3.8 04/09/2020   CL 105 04/09/2020   CREATININE 0.85 04/09/2020   BUN 17 04/09/2020   CO2 29 04/09/2020   TSH 1.77 04/09/2020   PSA 1.96 09/21/2017   HGBA1C 5.0 04/09/2020        Assessment &  Plan:

## 2020-04-10 ENCOUNTER — Encounter: Payer: Self-pay | Admitting: Internal Medicine

## 2020-04-10 LAB — URINE CULTURE: Result:: NO GROWTH

## 2020-04-10 NOTE — Assessment & Plan Note (Signed)
stable overall by history and exam, recent data reviewed with pt, and pt to continue medical treatment as before,  to f/u any worsening symptoms or concerns  

## 2020-04-10 NOTE — Assessment & Plan Note (Signed)
For film r/o stone, - urine studies

## 2020-04-10 NOTE — Assessment & Plan Note (Addendum)
Mild to mod, for antibx course,  to f/u any worsening symptoms or concerns  I spent 41 minutes in preparing to see the patient by review of recent labs, imaging and procedures, obtaining and reviewing separately obtained history, communicating with the patient and family or caregiver, ordering medications, tests or procedures, and documenting clinical information in the EHR including the differential Dx, treatment, and any further evaluation and other management of tragus infection, left flank pain, renal stone, HTN, HLD, hyperglycemia

## 2020-04-10 NOTE — Assessment & Plan Note (Signed)
Elevated here, for contd home monitoring dialy BP for 2 wks and call with average

## 2020-04-10 NOTE — Assessment & Plan Note (Signed)
Refer urology

## 2020-04-16 ENCOUNTER — Telehealth: Payer: Self-pay | Admitting: Internal Medicine

## 2020-04-16 NOTE — Telephone Encounter (Signed)
    Pt c/o medication issue:  1. Name of Medication: oxybutynin (DITROPAN-XL) 5 MG 24 hr tablet  2. How are you currently taking this medication (dosage and times per day)? No longer taking  3. Are you having a reaction (difficulty breathing--STAT)? no  4. What is your medication issue? Spouse states patient no longer taking med after 1 dose, felt weird, didn't want to take any longer. Seeking alternative

## 2020-04-18 ENCOUNTER — Encounter: Payer: Self-pay | Admitting: Internal Medicine

## 2020-04-19 DIAGNOSIS — N201 Calculus of ureter: Secondary | ICD-10-CM | POA: Diagnosis not present

## 2020-05-09 DIAGNOSIS — R1084 Generalized abdominal pain: Secondary | ICD-10-CM | POA: Diagnosis not present

## 2020-05-09 DIAGNOSIS — N201 Calculus of ureter: Secondary | ICD-10-CM | POA: Diagnosis not present

## 2020-05-14 ENCOUNTER — Other Ambulatory Visit: Payer: Self-pay | Admitting: Urology

## 2020-05-15 ENCOUNTER — Encounter (HOSPITAL_COMMUNITY): Payer: Self-pay

## 2020-05-15 NOTE — Patient Instructions (Signed)
DUE TO COVID-19 ONLY ONE VISITOR ARE ALLOWED TO COME WITH YOU AND STAY IN THE WAITING ROOM ONLY DURING PRE OP AND PROCEDURE. THEN TWO VISITORS MAY VISIT WITH YOU IN YOUR PRIVATE ROOM DURING VISITING HOURS ONLY!!   COVID SWAB TESTING MUST BE COMPLETED ON:  Thursday, May 16, 2020 at 2:25 PM    [address], [place] -[facility] enter pre surgical testing line (Must self quarantine after testing. Follow instructions on handout.)             Your procedure is scheduled on: Monday, May 20, 2020   Report to [facility] Main  Entrance    Report to admitting at 10:45 AM   Call this number if you have problems the morning of surgery [phone]   Do not eat food or drink liquids :After Midnight.   Oral Hygiene is also important to reduce your risk of infection.                                    Remember - BRUSH YOUR TEETH THE MORNING OF SURGERY WITH YOUR REGULAR TOOTHPASTE   Do NOT smoke after Midnight   Take these medicines the morning of surgery with A SIP OF WATER: Amlodipine, Atorvastatin                               You may not have any metal on your body including jewelry, and body piercings             Do not wear lotions, powders, perfumes/cologne, or deodorant                          Men may shave face and neck.   Do not bring valuables to the hospital. [place].   Contacts, dentures or bridgework may not be worn into surgery.    Patients discharged the day of surgery will not be allowed to drive home.   Special Instructions: Bring a copy of your healthcare power of attorney and living will documents         the day of surgery if you haven't scanned them in before.              Please read over the following fact sheets you were given: IF YOU HAVE QUESTIONS ABOUT YOUR PRE OP INSTRUCTIONS PLEASE CALL [phone]    - Preparing for Surgery Before surgery, you can play an  important role.  Because skin is not sterile, your skin needs to be as free of germs as possible.  You can reduce the number of germs on your skin by washing with CHG (chlorahexidine gluconate) soap before surgery.  CHG is an antiseptic cleaner which kills germs and bonds with the skin to continue killing germs even after washing. Please DO NOT use if you have an allergy to CHG or antibacterial soaps.  If your skin becomes reddened/irritated stop using the CHG and inform your nurse when you arrive at Short Stay. Do not shave (including legs and underarms) for at least 48 hours prior to the first CHG shower.  You may shave your face/neck.  Please follow these instructions carefully:  1.  Shower with CHG  Soap the night before surgery and the  morning of surgery.  2.  If you choose to wash your hair, wash your hair first as usual with your normal  shampoo.  3.  After you shampoo, rinse your hair and body thoroughly to remove the shampoo.                             4.  Use CHG as you would any other liquid soap.  You can apply chg directly to the skin and wash.  Gently with a scrungie or clean washcloth.  5.  Apply the CHG Soap to your body ONLY FROM THE NECK DOWN.   Do   not use on face/ open                           Wound or open sores. Avoid contact with eyes, ears mouth and   genitals (private parts).                       Wash face,  Genitals (private parts) with your normal soap.             6.  Wash thoroughly, paying special attention to the area where your    surgery  will be performed.  7.  Thoroughly rinse your body with warm water from the neck down.  8.  DO NOT shower/wash with your normal soap after using and rinsing off the CHG Soap.                9.  Pat yourself dry with a clean towel.            10.  Wear clean pajamas.            11.  Place clean sheets on your bed the night of your first shower and do not  sleep with pets. Day of Surgery : Do not apply any lotions/deodorants the  morning of surgery.  Please wear clean clothes to the hospital/surgery center.  FAILURE TO FOLLOW THESE INSTRUCTIONS MAY RESULT IN THE CANCELLATION OF YOUR SURGERY  PATIENT SIGNATURE_________________________________  NURSE SIGNATURE__________________________________  ________________________________________________________________________

## 2020-05-16 ENCOUNTER — Other Ambulatory Visit: Payer: Self-pay

## 2020-05-16 ENCOUNTER — Other Ambulatory Visit (HOSPITAL_COMMUNITY)
Admission: RE | Admit: 2020-05-16 | Discharge: 2020-05-16 | Disposition: A | Discharge status: Home / Self Care | Payer: Medicare Other | Source: Ambulatory Visit | Attending: Urology | Admitting: Urology

## 2020-05-16 ENCOUNTER — Encounter (HOSPITAL_COMMUNITY): Payer: Self-pay

## 2020-05-16 ENCOUNTER — Encounter (HOSPITAL_COMMUNITY)
Admission: RE | Admit: 2020-05-16 | Discharge: 2020-05-16 | Disposition: A | Discharge status: Home / Self Care | Payer: Medicare Other | Source: Ambulatory Visit | Attending: Urology | Admitting: Urology

## 2020-05-16 DIAGNOSIS — Z20822 Contact with and (suspected) exposure to covid-19: Secondary | ICD-10-CM | POA: Diagnosis not present

## 2020-05-16 DIAGNOSIS — R9431 Abnormal electrocardiogram [ECG] [EKG]: Secondary | ICD-10-CM | POA: Diagnosis not present

## 2020-05-16 DIAGNOSIS — Z01818 Encounter for other preprocedural examination: Secondary | ICD-10-CM | POA: Diagnosis not present

## 2020-05-16 HISTORY — DX: Unspecified hearing loss, unspecified ear: H91.90

## 2020-05-16 HISTORY — DX: Personal history of irradiation: Z92.3

## 2020-05-16 HISTORY — DX: Atherosclerosis of aorta: I70.0

## 2020-05-16 HISTORY — DX: Personal history of urinary calculi: Z87.442

## 2020-05-16 HISTORY — DX: Unspecified cataract: H26.9

## 2020-05-16 LAB — BASIC METABOLIC PANEL
Anion gap: 6 (ref 5–15)
BUN: 17 mg/dL (ref 8–23)
CO2: 26 mmol/L (ref 22–32)
Calcium: 9 mg/dL (ref 8.9–10.3)
Chloride: 109 mmol/L (ref 98–111)
Creatinine, Ser: 0.83 mg/dL (ref 0.61–1.24)
GFR calc Af Amer: >60 mL/min (ref >60)
GFR calc non Af Amer: >60 mL/min (ref >60)
Glucose, Bld: 101 mg/dL — ABNORMAL HIGH (ref 70–99)
Potassium: 3.8 mmol/L (ref 3.5–5.1)
Sodium: 141 mmol/L (ref 135–145)

## 2020-05-16 LAB — CBC
HCT: 38.3 % — ABNORMAL LOW (ref 39.0–52.0)
Hemoglobin: 13.3 g/dL (ref 13.0–17.0)
MCH: 31.1 pg (ref 26.0–34.0)
MCHC: 34.7 g/dL (ref 30.0–36.0)
MCV: 89.7 fL (ref 80.0–100.0)
Platelets: 172 10*3/uL (ref 150–400)
RBC: 4.27 MIL/uL (ref 4.22–5.81)
RDW: 12.4 % (ref 11.5–15.5)
WBC: 5.9 10*3/uL (ref 4.0–10.5)
nRBC: 0 % (ref 0.0–0.2)

## 2020-05-16 LAB — SARS CORONAVIRUS 2 (TAT 6-24 HRS): SARS Coronavirus 2: NEGATIVE

## 2020-05-16 NOTE — Progress Notes (Signed)
COVID Vaccine Completed: Yes Date COVID Vaccine completed: 01/25/20 COVID vaccine manufacturer:  Moderna    PCP - Dr. Marshall Cork last office visit 04/10/20, [facility] Cardiologist - N/A  Chest x-ray -N/A  EKG - 05/16/2020 in epic Stress Test - greater than 2 years ECHO - greater than 2 years Cardiac Cath -N/A    Sleep Study - N/A  CPAP - N/A   Fasting Blood Sugar - N/A  Checks Blood Sugar __N/A ___ times a day Hgb A1c 5.0 on 04/09/20 in epic  Blood Thinner Instructions:  N/A  Aspirin Instructions: Yes Last Dose: 05/14/2020  Anesthesia review: N/A   Patient denies shortness of breath, fever, cough and chest pain at PAT appointment   Patient verbalized understanding of instructions that were given to them at the PAT appointment. Patient was also instructed that they will need to review over the PAT instructions again at home before surgery.

## 2020-05-17 NOTE — H&P (Signed)
Office Visit Report     05/09/2020   --------------------------------------------------------------------------------   Elon Alas. Guile  MRN: [id number]  DOB: Apr 19, 1936, 84 year old Male  SSN: [id number]   PRIMARY CARE:  Cathlean Cower, MD  REFERRING:  Cathlean Cower, MD  PROVIDER:  Raynelle Bring, M.D.  TREATING:  Daine Gravel, NP  LOCATION:  [facility] [phone]     --------------------------------------------------------------------------------   CC/HPI: Kidney stone   Mr. Westcott is a pleasant 83 year old gentleman who is seen today for a kidney stone. He developed left-sided flank pain for the last few days with some radiation to his left upper quadrant. He has had prior kidney stones and has previously undergone ESWL in 2006. He denies any hematuria. He denies fever or nausea/vomiting. He was seen by his primary care provider and had a KUB x-ray that demonstrated a calcification measuring approximately 5 mm near the left UPJ.   05/09/20: Mr. Bonebrake presents today for follow up for a left UPJ 65mm calculus. He has not seen a stone pass in his urine. He endorses bilateral flank pain with radiation into his groin. He denies gross hematuria, nausea, vomiting and fevers. He has not required any pain medications.     ALLERGIES: Lovastatin TABS - Skin Rash Oxybutynin - Chest tightening sensations    MEDICATIONS: Aspirin 81 mg tablet,chewable  Hydrocodone-Acetaminophen 5 mg-325 mg tablet 1-2 tablet PO Q 6 H  Atorvastatin Calcium 10 mg tablet  Norvasc 5 mg tablet     GU PSH: ESWL - 2015 Simple orchiectomy - 2015       PSH Notes: Tonsillectomy, Lithotripsy, Orchiectomy Left   NON-GU PSH: Remove Tonsils - 2015     GU PMH: Ureteral calculus - 04/19/2020 ED due to arterial insufficiency, Erectile dysfunction due to arterial insufficiency - 2015 History of urolithiasis, History of renal calculi - 2015 Urinary Frequency, Increased urinary frequency - 2015 Weak Urinary  Stream, Weak urinary stream - 2015    NON-GU PMH: Encounter for general adult medical examination without abnormal findings, Encounter for preventive health examination - 2015 Personal history of other diseases of the circulatory system, History of hypertension - 2015 Hypercholesterolemia Hypertension    FAMILY HISTORY: Alzheimer's Disease - Runs In Family Hypertension - Father Strokes - Runs In Family   SOCIAL HISTORY: Marital Status: Married Preferred Language: English; Ethnicity: Not Hispanic Or Latino; Race: White Current Smoking Status: Patient does not smoke anymore. Has not smoked since 04/06/1969. Smoked for 10 years. Smoked less than 1/2 pack per day.   Tobacco Use Assessment Completed: Used Tobacco in last 30 days? Does not drink anymore.  Does not drink caffeine.     Notes: Former smoker, Married, Retired, Caffeine use, Number of children, Alcohol use   REVIEW OF SYSTEMS:    GU Review Male:   Patient reports frequent urination, hard to postpone urination, and get up at night to urinate. Patient denies burning/ pain with urination, leakage of urine, stream starts and stops, trouble starting your stream, have to strain to urinate , erection problems, and penile pain.  Gastrointestinal (Upper):   Patient denies nausea, vomiting, and indigestion/ heartburn.  Gastrointestinal (Lower):   Patient denies diarrhea and constipation.  Constitutional:   Patient denies fever, night sweats, weight loss, and fatigue.  Skin:   Patient denies skin rash/ lesion and itching.  Eyes:   Patient denies double vision and blurred vision.  Ears/ Nose/ Throat:   Patient denies sore throat and sinus problems.  Hematologic/Lymphatic:  Patient denies swollen glands and easy bruising.  Cardiovascular:   Patient denies leg swelling and chest pains.  Respiratory:   Patient denies cough and shortness of breath.  Endocrine:   Patient denies excessive thirst.  Musculoskeletal:   Patient reports back pain  and joint pain.   Neurological:   Patient denies headaches and dizziness.  Psychologic:   Patient denies depression and anxiety.   VITAL SIGNS:      05/09/2020 10:16 AM  BP 143/80 mmHg  Pulse 73 /min  Temperature 97.9 F / 36.6 C   MULTI-SYSTEM PHYSICAL EXAMINATION:    Constitutional: Well-nourished. No physical deformities. Normally developed. Good grooming.  Neck: Neck symmetrical, not swollen. Normal tracheal position.  Respiratory: No labored breathing, no use of accessory muscles.   Cardiovascular: Normal temperature, normal extremity pulses, no swelling, no varicosities.  Lymphatic: No enlargement of neck, axillae, groin.  Skin: No paleness, no jaundice, no cyanosis. No lesion, no ulcer, no rash.  Neurologic / Psychiatric: Oriented to time, oriented to place, oriented to person. No depression, no anxiety, no agitation.  Gastrointestinal: slight tenderness to left CVA, lower back tenderness  Eyes: Normal conjunctivae. Normal eyelids.  Ears, Nose, Mouth, and Throat: Left ear no scars, no lesions, no masses. Right ear no scars, no lesions, no masses. Nose no scars, no lesions, no masses. Normal hearing. Normal lips.  Musculoskeletal: Normal gait and station of head and neck.     Complexity of Data:  Source Of History:  Patient, Medical Record Summary  Records Review:   Previous Doctor Records, Previous Patient Records  Urine Test Review:   Urinalysis, Urine Culture  X-Ray Review: KUB: Reviewed Films. Discussed With Patient.     05/09/20  Urinalysis  Urine Appearance Clear   Urine Color Yellow   Urine Glucose Neg mg/dL  Urine Bilirubin Neg mg/dL  Urine Ketones Neg mg/dL  Urine Specific Gravity 1.020   Urine Blood 3+ ery/uL  Urine pH 6.5   Urine Protein 1+ mg/dL  Urine Urobilinogen 0.2 mg/dL  Urine Nitrites Neg   Urine Leukocyte Esterase Neg leu/uL  Urine WBC/hpf 0 - 5/hpf   Urine RBC/hpf 10 - 20/hpf   Urine Epithelial Cells 0 - 5/hpf   Urine Bacteria Rare (0-9/hpf)    Urine Mucous Present   Urine Yeast NS (Not Seen)   Urine Trichomonas Not Present   Urine Cystals NS (Not Seen)   Urine Casts NS (Not Seen)   Urine Sperm Not Present    PROCEDURES:         C.T. Urogram - [id number]      Patient confirmed No Neulasta OnPro Device.          KUB - [id number]  A single view of the abdomen is obtained. There is a prominent bowel gas pattern. Bilateral renal shadow are well visualized. There is an approximate 69mm opacity noted within the left renal shadow, near the area of the UPJ. There are no opacities within the expected course of the left ureter. There are multiple small opacities within the right renal shadow. There are stable phleboliths noted within the right pelvic inlet. There is an opacity noted within the expected course of the right ureter that was not on previous KUBS.       Patient confirmed No Neulasta OnPro Device.            Urinalysis w/Scope Dipstick Dipstick Cont'd Micro  Color: Yellow Bilirubin: Neg mg/dL WBC/hpf: 0 - 5/hpf  Appearance: Clear Ketones: Neg mg/dL RBC/hpf: 10 -  20/hpf  Specific Gravity: 1.020 Blood: 3+ ery/uL Bacteria: Rare (0-9/hpf)  pH: 6.5 Protein: 1+ mg/dL Cystals: NS (Not Seen)  Glucose: Neg mg/dL Urobilinogen: 0.2 mg/dL Casts: NS (Not Seen)    Nitrites: Neg Trichomonas: Not Present    Leukocyte Esterase: Neg leu/uL Mucous: Present      Epithelial Cells: 0 - 5/hpf      Yeast: NS (Not Seen)      Sperm: Not Present    ASSESSMENT:      ICD-10 Details  1 GU:   Ureteral calculus - N20.1 Left, Acute, Uncomplicated   PLAN:            Medications Stop Meds: Hydrocodone-Acetaminophen 5 mg-325 mg tablet 1-2 tablet PO Q 6 H  Start: 04/19/2020  Discontinue: 05/09/2020  - Reason: The medication cycle was completed.            Orders Labs CULTURE, URINE  X-Rays: C.T. Stone Protocol Without Contrast  X-Ray Notes: bilateral flank pain, KUB is done and inconclusive, concerned for bilateral hydro History:   Hematuria:  Yes/No  Patient to see MD after exam: Yes/No  Previous exam: CT / IVP/ US/ KUB/ None  When:  Where:  Diabetic: Yes/ No  High Blood Pressure: Yes/ No  BUN/ Creatine:  Date of last BUN Creatinine:  Weight in pounds:  Allergy- Contrasts/ Shellfish: Yes/ No  Conflicting diabetic meds: Yes/ No  Oral contrast and instructions given to patient:   Prior Authorization #: no auth required             Schedule Return Visit/Planned Activity: Next Available Appointment - Schedule Surgery          Document Letter(s):  Created for Patient: Clinical Summary         Notes:   Urinalysis is not concerning for infectious process. Urine sent for precautionary culture. KUB was concerning for a new right sided obstructing calculus. A CT scan was obtained. CT scan demonstrates a 98mm nonobstructing right lower pole renal calculus, a left sided 8 mm calculus within the collecting system and a 3 mm distal left UVJ calculus. We discussed management options including ESWL and URS. He would like to proceed with URS. We discussed the risks of URS including but not limited to infection, injury to surrounding structures, bleeding, and failure of procedure. Advised that a STENT would most likely be placed given the location of the calculus. He voiced understanding. A surgical posting sheet was given to Dr. Lynne Logan scheduler. Strict return precautions advised int he interval.     * Signed by Daine Gravel, NP on 05/11/20 at 9:22 AM (EDT)*

## 2020-05-20 ENCOUNTER — Ambulatory Visit (HOSPITAL_COMMUNITY): Payer: Medicare Other | Admitting: Anesthesiology

## 2020-05-20 ENCOUNTER — Encounter (HOSPITAL_COMMUNITY)
Admission: RE | Disposition: A | Discharge status: Home / Self Care | Payer: Self-pay | Source: Home / Self Care | Attending: Urology

## 2020-05-20 ENCOUNTER — Encounter (HOSPITAL_COMMUNITY): Payer: Self-pay | Admitting: Urology

## 2020-05-20 ENCOUNTER — Ambulatory Visit (HOSPITAL_COMMUNITY): Payer: Medicare Other

## 2020-05-20 ENCOUNTER — Ambulatory Visit (HOSPITAL_COMMUNITY)
Admission: RE | Admit: 2020-05-20 | Discharge: 2020-05-20 | Disposition: A | Discharge status: Home / Self Care | Payer: Medicare Other | Attending: Urology | Admitting: Urology

## 2020-05-20 DIAGNOSIS — Z87442 Personal history of urinary calculi: Secondary | ICD-10-CM | POA: Insufficient documentation

## 2020-05-20 DIAGNOSIS — Z79899 Other long term (current) drug therapy: Secondary | ICD-10-CM | POA: Insufficient documentation

## 2020-05-20 DIAGNOSIS — I10 Essential (primary) hypertension: Secondary | ICD-10-CM | POA: Insufficient documentation

## 2020-05-20 DIAGNOSIS — Z888 Allergy status to other drugs, medicaments and biological substances status: Secondary | ICD-10-CM | POA: Insufficient documentation

## 2020-05-20 DIAGNOSIS — Z7982 Long term (current) use of aspirin: Secondary | ICD-10-CM | POA: Diagnosis not present

## 2020-05-20 DIAGNOSIS — E7800 Pure hypercholesterolemia, unspecified: Secondary | ICD-10-CM | POA: Diagnosis not present

## 2020-05-20 DIAGNOSIS — N200 Calculus of kidney: Secondary | ICD-10-CM | POA: Diagnosis not present

## 2020-05-20 DIAGNOSIS — Z87891 Personal history of nicotine dependence: Secondary | ICD-10-CM | POA: Insufficient documentation

## 2020-05-20 DIAGNOSIS — N202 Calculus of kidney with calculus of ureter: Secondary | ICD-10-CM | POA: Insufficient documentation

## 2020-05-20 HISTORY — PX: URETEROSCOPY WITH HOLMIUM LASER LITHOTRIPSY: SHX6645

## 2020-05-20 SURGERY — URETEROSCOPY, WITH LITHOTRIPSY USING HOLMIUM LASER
Anesthesia: Choice | Laterality: Left

## 2020-05-20 MED ORDER — HYDROCODONE-ACETAMINOPHEN 5-325 MG PO TABS: 1.0000 | ORAL_TABLET | Freq: Four times a day (QID) | ORAL | 0 refills | Status: AC | PRN

## 2020-05-20 MED ORDER — BELLADONNA ALKALOIDS-OPIUM 16.2-30 MG RE SUPP
RECTAL | Status: AC
  Filled 2020-05-20: qty 1

## 2020-05-20 MED ORDER — GLYCOPYRROLATE 0.2 MG/ML IJ SOLN
INTRAMUSCULAR | Status: DC | PRN
  Administered 2020-05-20: .1 mg via INTRAVENOUS

## 2020-05-20 MED ORDER — EPHEDRINE SULFATE-NACL 50-0.9 MG/10ML-% IV SOSY
PREFILLED_SYRINGE | INTRAVENOUS | Status: DC | PRN
  Administered 2020-05-20 (×2): 5 mg via INTRAVENOUS

## 2020-05-20 MED ORDER — HYDROMORPHONE HCL 1 MG/ML IJ SOLN
INTRAMUSCULAR | Status: AC
  Filled 2020-05-20: qty 1

## 2020-05-20 MED ORDER — ORAL CARE MOUTH RINSE: 15.0000 mL | Freq: Once | OROMUCOSAL | Status: AC

## 2020-05-20 MED ORDER — AMISULPRIDE (ANTIEMETIC) 5 MG/2ML IV SOLN
INTRAVENOUS | Status: AC
  Filled 2020-05-20: qty 4

## 2020-05-20 MED ORDER — PROPOFOL 10 MG/ML IV BOLUS
INTRAVENOUS | Status: DC | PRN
  Administered 2020-05-20: 130 mg via INTRAVENOUS

## 2020-05-20 MED ORDER — FENTANYL CITRATE (PF) 100 MCG/2ML IJ SOLN
INTRAMUSCULAR | Status: DC | PRN
  Administered 2020-05-20: 25 ug via INTRAVENOUS
  Administered 2020-05-20: 50 ug via INTRAVENOUS
  Administered 2020-05-20: 25 ug via INTRAVENOUS

## 2020-05-20 MED ORDER — AMISULPRIDE (ANTIEMETIC) 5 MG/2ML IV SOLN: 10.0000 mg | Freq: Once | INTRAVENOUS | Status: DC

## 2020-05-20 MED ORDER — MEPERIDINE HCL 50 MG/ML IJ SOLN: 6.2500 mg | INTRAMUSCULAR | Status: DC | PRN

## 2020-05-20 MED ORDER — LIDOCAINE HCL (CARDIAC) PF 100 MG/5ML IV SOSY
PREFILLED_SYRINGE | INTRAVENOUS | Status: DC | PRN
  Administered 2020-05-20: 100 mg via INTRAVENOUS

## 2020-05-20 MED ORDER — ONDANSETRON HCL 4 MG/2ML IJ SOLN
INTRAMUSCULAR | Status: DC | PRN
  Administered 2020-05-20: 4 mg via INTRAVENOUS

## 2020-05-20 MED ORDER — EPHEDRINE 5 MG/ML INJ
INTRAVENOUS | Status: AC
  Filled 2020-05-20: qty 10

## 2020-05-20 MED ORDER — FENTANYL CITRATE (PF) 100 MCG/2ML IJ SOLN
INTRAMUSCULAR | Status: AC
  Filled 2020-05-20: qty 2

## 2020-05-20 MED ORDER — CEFAZOLIN SODIUM-DEXTROSE 2-4 GM/100ML-% IV SOLN
2.0000 g | Freq: Once | INTRAVENOUS | Status: AC
  Administered 2020-05-20: 2 g via INTRAVENOUS
  Filled 2020-05-20: qty 100

## 2020-05-20 MED ORDER — ONDANSETRON HCL 4 MG/2ML IJ SOLN
INTRAMUSCULAR | Status: AC
  Filled 2020-05-20: qty 2

## 2020-05-20 MED ORDER — HYDROMORPHONE HCL 1 MG/ML IJ SOLN
0.2500 mg | INTRAMUSCULAR | Status: DC | PRN
  Administered 2020-05-20 (×4): 0.5 mg via INTRAVENOUS

## 2020-05-20 MED ORDER — LACTATED RINGERS IV SOLN: INTRAVENOUS | Status: DC

## 2020-05-20 MED ORDER — IOHEXOL 300 MG/ML  SOLN
INTRAMUSCULAR | Status: DC | PRN
  Administered 2020-05-20: 10 mL

## 2020-05-20 MED ORDER — ONDANSETRON HCL 4 MG/2ML IJ SOLN
4.0000 mg | Freq: Once | INTRAMUSCULAR | Status: AC | PRN
  Administered 2020-05-20: 4 mg via INTRAVENOUS

## 2020-05-20 MED ORDER — SODIUM CHLORIDE 0.9 % IR SOLN
Status: DC | PRN
  Administered 2020-05-20 (×2): 3000 mL

## 2020-05-20 MED ORDER — BELLADONNA ALKALOIDS-OPIUM 16.2-60 MG RE SUPP
RECTAL | Status: DC | PRN
  Administered 2020-05-20: 1 via RECTAL

## 2020-05-20 MED ORDER — CHLORHEXIDINE GLUCONATE 0.12 % MT SOLN
15.0000 mL | Freq: Once | OROMUCOSAL | Status: AC
  Administered 2020-05-20: 15 mL via OROMUCOSAL

## 2020-05-20 SURGICAL SUPPLY — 20 items
BAG URO CATCHER STRL LF (MISCELLANEOUS) ×3 IMPLANT
BASKET ZERO TIP NITINOL 2.4FR (BASKET) ×3 IMPLANT
CATH INTERMIT  6FR 70CM (CATHETERS) ×3 IMPLANT
CLOTH BEACON ORANGE TIMEOUT ST (SAFETY) ×3 IMPLANT
FIBER LASER FLEXIVA 365 (UROLOGICAL SUPPLIES) IMPLANT
FIBER LASER TRAC TIP (UROLOGICAL SUPPLIES) ×3 IMPLANT
GLOVE BIOGEL M STRL SZ7.5 (GLOVE) ×3 IMPLANT
GOWN STRL REUS W/TWL LRG LVL3 (GOWN DISPOSABLE) ×6 IMPLANT
GUIDEWIRE ANG ZIPWIRE 038X150 (WIRE) IMPLANT
GUIDEWIRE STR DUAL SENSOR (WIRE) ×3 IMPLANT
IV NS 1000ML (IV SOLUTION) ×3
IV NS 1000ML BAXH (IV SOLUTION) ×1 IMPLANT
KIT TURNOVER KIT A (KITS) IMPLANT
MANIFOLD NEPTUNE II (INSTRUMENTS) ×3 IMPLANT
SHEATH URETERAL 12FRX35CM (MISCELLANEOUS) ×3 IMPLANT
STENT URET 6FRX24 CONTOUR (STENTS) ×3 IMPLANT
TRAY CYSTO PACK (CUSTOM PROCEDURE TRAY) ×3 IMPLANT
TUBING CONNECTING 10 (TUBING) ×2 IMPLANT
TUBING CONNECTING 10' (TUBING) ×1
TUBING UROLOGY SET (TUBING) ×3 IMPLANT

## 2020-05-20 NOTE — Anesthesia Preprocedure Evaluation (Signed)
Anesthesia Evaluation  Patient identified by MRN, date of birth, ID band Patient awake    Reviewed: Allergy & Precautions, NPO status , Patient's Chart, lab work & pertinent test results  Airway Mallampati: I  TM Distance: >3 FB Neck ROM: Full    Dental   Pulmonary former smoker,    Pulmonary exam normal        Cardiovascular hypertension, Pt. on medications Normal cardiovascular exam     Neuro/Psych    GI/Hepatic GERD  Medicated and Controlled,  Endo/Other    Renal/GU      Musculoskeletal   Abdominal   Peds  Hematology   Anesthesia Other Findings   Reproductive/Obstetrics                             Anesthesia Physical Anesthesia Plan  ASA: III  Anesthesia Plan: General   Post-op Pain Management:    Induction: Intravenous  PONV Risk Score and Plan: 2  Airway Management Planned: Oral ETT  Additional Equipment:   Intra-op Plan:   Post-operative Plan: Extubation in OR  Informed Consent: I have reviewed the patients History and Physical, chart, labs and discussed the procedure including the risks, benefits and alternatives for the proposed anesthesia with the patient or authorized representative who has indicated his/her understanding and acceptance.       Plan Discussed with: CRNA and Surgeon  Anesthesia Plan Comments:         Anesthesia Quick Evaluation

## 2020-05-20 NOTE — Anesthesia Procedure Notes (Signed)
Procedure Name: LMA Insertion Date/Time: 05/20/2020 1:29 PM Performed by: Lavina Hamman, CRNA Pre-anesthesia Checklist: Patient identified, Emergency Drugs available, Suction available and Patient being monitored Patient Re-evaluated:Patient Re-evaluated prior to induction Oxygen Delivery Method: Circle System Utilized Preoxygenation: Pre-oxygenation with 100% oxygen Induction Type: IV induction Ventilation: Mask ventilation without difficulty LMA: LMA inserted LMA Size: 4.0 Number of attempts: 1 Airway Equipment and Method: Bite block Placement Confirmation: positive ETCO2 Tube secured with: Tape Dental Injury: Teeth and Oropharynx as per pre-operative assessment

## 2020-05-20 NOTE — Anesthesia Procedure Notes (Signed)
Date/Time: 05/20/2020 2:02 PM Performed by: Cynda Familia, CRNA Oxygen Delivery Method: Simple face mask Placement Confirmation: positive ETCO2 and breath sounds checked- equal and bilateral Dental Injury: Teeth and Oropharynx as per pre-operative assessment

## 2020-05-20 NOTE — Op Note (Addendum)
Preoperative diagnosis: Left ureteral calculus and left renal pelvis calculs  Postoperative diagnosis: Left renal pelvis calculus  Procedure:  Cystoscopy Left ureteroscopy and stone removal Ureteroscopic laser lithotripsy Left ureteral stent placement (6Fr x 24cm) Left retrograde pyelography with interpretation  Surgeon: Pryor Curia. M.D.  Resident: Sharlot Gowda, M.D.  Anesthesia: General  Complications: None  Intraoperative findings:  - Cystoscopy with moderate bilobar hypertrophy and high riding bladder neck - Semirigid ureteroscopy without distal ureteral stone - Left retrograde pyelography demonstrated a filling defect within the renal pelvis consistent with the patient's known calculus without other abnormalities noted - Left flexible ureteroscopy with soft dihydrate appearing stone. Dusted completely to two fragments which were basket extracted - Mild proximal ureteral mucosal abrasion secondary to access sheath - Uncomplicated placement of 6Fr x 24cm JJ ureteral stent  EBL: Minimal  Specimens: Left renal calculus  Disposition of specimens: [facility] for stone analysis  Indication: Connor Morgan  is a 84 y.o. patient with urolithiasis. After reviewing the management options for treatment, they elected to proceed with the above surgical procedure(s). We have discussed the potential benefits and risks of the procedure, side effects of the proposed treatment, the likelihood of the patient achieving the goals of the procedure, and any potential problems that might occur during the procedure or recuperation. Informed consent has been obtained.  Description of procedure:  The patient was taken to the operating room and general anesthesia was induced.  The patient was placed in the dorsal lithotomy position, prepped and draped in the usual sterile fashion, and preoperative antibiotics were administered. A preoperative time-out was performed.    Cystourethroscopy was performed.  The patient's urethra was examined and was normal. The bladder was then systematically examined in its entirety. There was no evidence for any bladder tumors, stones, or other mucosal pathology.    Attention then turned to the left ureteral orifice and a ureteral catheter was used to intubate the ureteral orifice.  Omnipaque contrast was injected through the ureteral catheter and a retrograde pyelogram was performed with findings as dictated above.  A 0.38 sensor guidewire was then advanced up the left ureter into the renal pelvis under fluoroscopic guidance.  A 12/14 Fr ureteral access sheath was then advanced over the guide wire with mild resistance in the distal ureter. The digital flexible ureteroscope was then advanced through the access sheath into the ureter next to the guidewire and the calculus was identified and was located in the renal pelvis, it was nudged into the upper pole calyx.   The stone was then fragmented with the 200 micron holmium laser fiber on a setting of 0.2J and frequency of 50 Hz until all but two fragments were turned into dust-like particles no larger than the width of the laser fiber. These two fragments were then removed with a zero tip nitinol basket.  Reinspection of the ureter/renal pelvis revealed no remaining visible stones or fragments of significant size.   The safety wire was then replaced and the access sheath removed. The proximal urethral mucosa was abrated and there was small volume contrast extravasation. The guidewire was backloaded through the cystoscope and a ureteral stent was advance over the wire using Seldinger technique.  The stent without strings was positioned appropriately under fluoroscopic and cystoscopic guidance.  The wire was then removed with an adequate stent curl noted in the renal pelvis as well as in the bladder. The bladder was emptied. A belladonna suppository was placed.   The bladder  was then  emptied and the procedure ended.  The patient appeared to tolerate the procedure well and without complications.  The patient was able to be awakened and transferred to the recovery unit in satisfactory condition.

## 2020-05-20 NOTE — Discharge Instructions (Signed)

## 2020-05-20 NOTE — Interval H&P Note (Signed)
History and Physical Interval Note:  05/20/2020 12:23 PM  Connor Morgan  has presented today for surgery, with the diagnosis of LEFT URETEROPELVIC JUNCTION STONE.  The various methods of treatment have been discussed with the patient and family. After consideration of risks, benefits and other options for treatment, the patient has consented to  Procedure(s) with comments: URETEROSCOPY WITH HOLMIUM LASER LITHOTRIPSY/ RETROGRADE/ STENT PLACEMENT (Left) - ONLY NEEDS 60 MIN as a surgical intervention.  The patient's history has been reviewed, patient examined, no change in status, stable for surgery.  I have reviewed the patient's chart and labs.  Questions were answered to the patient's satisfaction.     [employer]

## 2020-05-20 NOTE — Transfer of Care (Signed)
Immediate Anesthesia Transfer of Care Note  Patient: Connor Morgan  Procedure(s) Performed: URETEROSCOPY WITH HOLMIUM LASER LITHOTRIPSY/ RETROGRADE/ STENT PLACEMENT (Left )  Patient Location: PACU  Anesthesia Type:General  Level of Consciousness: sedated  Airway & Oxygen Therapy: Patient Spontanous Breathing and Patient connected to face mask oxygen  Post-op Assessment: Report given to RN and Post -op Vital signs reviewed and stable  Post vital signs: Reviewed and stable  Last Vitals:  Vitals Value Taken Time  BP    Temp    Pulse 64 05/20/20 1412  Resp 10 05/20/20 1412  SpO2 100 % 05/20/20 1412  Vitals shown include unvalidated device data.  Last Pain:  Vitals:   05/20/20 1409  TempSrc:   PainSc: (P) Asleep      Patients Stated Pain Goal: 2 (26/83/41 9622)  Complications: No complications documented.

## 2020-05-21 ENCOUNTER — Encounter (HOSPITAL_COMMUNITY): Payer: Self-pay | Admitting: Urology

## 2020-05-21 NOTE — Anesthesia Postprocedure Evaluation (Signed)
Anesthesia Post Note  Patient: HERMON ZEA  Procedure(s) Performed: URETEROSCOPY WITH HOLMIUM LASER LITHOTRIPSY/ RETROGRADE/ STENT PLACEMENT (Left )     Patient location during evaluation: PACU Anesthesia Type: General Level of consciousness: awake and alert Pain management: pain level controlled Vital Signs Assessment: post-procedure vital signs reviewed and stable Respiratory status: spontaneous breathing, nonlabored ventilation, respiratory function stable and patient connected to nasal cannula oxygen Cardiovascular status: blood pressure returned to baseline and stable Postop Assessment: no apparent nausea or vomiting Anesthetic complications: no   No complications documented.  Last Vitals:  Vitals:   05/20/20 1545 05/20/20 1753  BP: (!) 160/78 (!) 160/75  Pulse: 79 76  Resp: 16 18  Temp:  (!) 36.3 C  SpO2: 93% 96%    Last Pain:  Vitals:   05/20/20 1753  TempSrc:   PainSc: 0-No pain                 Alyric Parkin DAVID

## 2020-06-11 ENCOUNTER — Telehealth: Payer: Self-pay | Admitting: Internal Medicine

## 2020-06-11 NOTE — Telephone Encounter (Signed)
Rec'd from [facility] forwarded 12 pages to Dr. Cathlean Cower

## 2020-09-17 ENCOUNTER — Ambulatory Visit: Payer: Self-pay

## 2020-10-11 ENCOUNTER — Ambulatory Visit: Payer: Medicare Other | Admitting: Internal Medicine

## 2020-10-21 ENCOUNTER — Encounter: Payer: Self-pay | Admitting: Internal Medicine

## 2020-10-21 ENCOUNTER — Ambulatory Visit (INDEPENDENT_AMBULATORY_CARE_PROVIDER_SITE_OTHER): Payer: Medicare Other | Admitting: Internal Medicine

## 2020-10-21 ENCOUNTER — Other Ambulatory Visit: Payer: Self-pay

## 2020-10-21 VITALS — BP 126/70 | HR 70 | Temp 97.8°F | Ht 71.0 in | Wt 163.0 lb

## 2020-10-21 DIAGNOSIS — N3281 Overactive bladder: Secondary | ICD-10-CM

## 2020-10-21 DIAGNOSIS — E538 Deficiency of other specified B group vitamins: Secondary | ICD-10-CM | POA: Diagnosis not present

## 2020-10-21 DIAGNOSIS — I10 Essential (primary) hypertension: Secondary | ICD-10-CM | POA: Diagnosis not present

## 2020-10-21 DIAGNOSIS — E559 Vitamin D deficiency, unspecified: Secondary | ICD-10-CM

## 2020-10-21 DIAGNOSIS — R350 Frequency of micturition: Secondary | ICD-10-CM

## 2020-10-21 DIAGNOSIS — E785 Hyperlipidemia, unspecified: Secondary | ICD-10-CM | POA: Diagnosis not present

## 2020-10-21 DIAGNOSIS — R7302 Impaired glucose tolerance (oral): Secondary | ICD-10-CM | POA: Diagnosis not present

## 2020-10-21 LAB — URINALYSIS, ROUTINE W REFLEX MICROSCOPIC
Bilirubin Urine: NEGATIVE
Hgb urine dipstick: NEGATIVE
Ketones, ur: NEGATIVE
Leukocytes,Ua: NEGATIVE
Nitrite: NEGATIVE
Specific Gravity, Urine: 1.02 (ref 1.000–1.030)
Total Protein, Urine: NEGATIVE
Urine Glucose: NEGATIVE
Urobilinogen, UA: 0.2 (ref 0.0–1.0)
pH: 7.5 (ref 5.0–8.0)

## 2020-10-21 LAB — BASIC METABOLIC PANEL
BUN: 16 mg/dL (ref 6–23)
CO2: 33 mEq/L — ABNORMAL HIGH (ref 19–32)
Calcium: 9.6 mg/dL (ref 8.4–10.5)
Chloride: 103 mEq/L (ref 96–112)
Creatinine, Ser: 0.8 mg/dL (ref 0.40–1.50)
GFR: 82.33 mL/min (ref >60.00)
Glucose, Bld: 88 mg/dL (ref 70–99)
Potassium: 4.3 mEq/L (ref 3.5–5.1)
Sodium: 142 mEq/L (ref 135–145)

## 2020-10-21 LAB — HEPATIC FUNCTION PANEL
ALT: 13 U/L (ref 0–53)
AST: 18 U/L (ref 0–37)
Albumin: 4.6 g/dL (ref 3.5–5.2)
Alkaline Phosphatase: 103 U/L (ref 39–117)
Bilirubin, Direct: 0.1 mg/dL (ref 0.0–0.3)
Total Bilirubin: 0.8 mg/dL (ref 0.2–1.2)
Total Protein: 7.3 g/dL (ref 6.0–8.3)

## 2020-10-21 LAB — LIPID PANEL
Cholesterol: 163 mg/dL (ref 0–200)
HDL: 69.7 mg/dL (ref >39.00)
LDL Cholesterol: 74 mg/dL (ref 0–99)
NonHDL: 93.61
Total CHOL/HDL Ratio: 2
Triglycerides: 98 mg/dL (ref 0.0–149.0)
VLDL: 19.6 mg/dL (ref 0.0–40.0)

## 2020-10-21 LAB — HEMOGLOBIN A1C: Hgb A1c MFr Bld: 5.1 % (ref 4.6–6.5)

## 2020-10-21 LAB — VITAMIN D 25 HYDROXY (VIT D DEFICIENCY, FRACTURES): VITD: 25.55 ng/mL — ABNORMAL LOW (ref 30.00–100.00)

## 2020-10-21 LAB — VITAMIN B12: Vitamin B-12: 132 pg/mL — ABNORMAL LOW (ref 211–911)

## 2020-10-21 NOTE — Patient Instructions (Signed)

## 2020-10-21 NOTE — Progress Notes (Signed)
Subjective:    Patient ID: Connor Morgan, male    DOB: [birth year], 84 y.o.   MRN: [id number]  HPI  Here to f/u; overall doing ok,  Pt denies chest pain, increasing sob or doe, wheezing, orthopnea, PND, increased LE swelling, palpitations, dizziness or syncope.  Pt denies new neurological symptoms such as new headache, or facial or extremity weakness or numbness.  Pt denies polydipsia, polyuria, or low sugar episode.  Pt states overall good compliance with meds,also co urinary frequency x 3-4 months but Denies urinary symptoms such as dysuria, urgency, flank pain, hematuria or n/v, fever, chills. Past Medical History:  Diagnosis Date  . ALLERGIC RHINITIS 10/19/2007  . Aortic atherosclerosis ([place])   . BENIGN PROSTATIC HYPERTROPHY 10/19/2007  . Cataract   . CHEST PAIN, ATYPICAL 12/03/2008  . DYSPNEA 11/20/2008  . ERECTILE DYSFUNCTION 10/19/2007  . FATIGUE 10/19/2007  . FREQUENCY, URINARY 11/20/2008  . Gastric ulcer   . GERD 10/19/2007  . GLUCOSE INTOLERANCE 10/19/2007  . HEMORRHOIDS, HX OF 08/13/2007  . History of kidney stones   . History of radiation exposure    [employer]  . HOH (hard of hearing)   . HYPERLIPIDEMIA 10/19/2007  . HYPERTENSION 08/13/2007  . Impaired glucose tolerance 07/08/2011  . NEPHROLITHIASIS 08/13/2007  . NEPHROLITHIASIS, HX OF 10/19/2007  . ORCHIECTOMY, HX OF 10/19/2007  . Spermatocele 08/13/2007   Past Surgical History:  Procedure Laterality Date  . COLONOSCOPY    . CYSTOSCOPY    . INGUINAL HERNIA REPAIR Bilateral   . LITHOTRIPSY    . s/p right orchiectomy     benign mass, right  . SKIN LESION EXCISION     neck  . TONSILLECTOMY    . UPPER GI ENDOSCOPY    . URETEROSCOPY WITH HOLMIUM LASER LITHOTRIPSY Left 05/20/2020   Procedure: URETEROSCOPY WITH HOLMIUM LASER LITHOTRIPSY/ RETROGRADE/ STENT PLACEMENT;  Surgeon: Raynelle Bring, MD;  Location: [facility];  Service: Urology;  Laterality: Left;  ONLY NEEDS 60 MIN    reports that he has quit smoking. He has never used  smokeless tobacco. He reports previous alcohol use. He reports that he does not use drugs. family history includes Dementia in his father and mother; Heart disease in his brother and father; Kidney disease in his mother. Allergies  Allergen Reactions  . Mevacor [Lovastatin]     REACTION: myalgias   Current Outpatient Medications on File Prior to Visit  Medication Sig Dispense Refill  . amLODipine (NORVASC) 5 MG tablet Take 1 tablet (5 mg total) by mouth daily. 90 tablet 3  . aspirin 81 MG tablet Take 81 mg by mouth daily.      Marland Kitchen atorvastatin (LIPITOR) 10 MG tablet Take 1 tablet (10 mg total) by mouth daily. 90 tablet 3  . HYDROcodone-acetaminophen (NORCO/VICODIN) 5-325 MG tablet Take 1-2 tablets by mouth every 6 (six) hours.    Marland Kitchen HYDROcodone-acetaminophen (NORCO/VICODIN) 5-325 MG tablet Take 1-2 tablets by mouth every 6 (six) hours as needed. 15 tablet 0  . ondansetron (ZOFRAN) 4 MG tablet Take 4 mg by mouth every 8 (eight) hours as needed.    Marland Kitchen Propylene Glycol 0.6 % SOLN Place 1 drop into both eyes 4 (four) times daily as needed (dry eyes).    . tamsulosin (FLOMAX) 0.4 MG CAPS capsule Take 0.4 mg by mouth at bedtime.    . vitamin B-12 (CYANOCOBALAMIN) 1000 MCG tablet Take 1 tablet (1,000 mcg total) by mouth daily. 90 tablet 3   No current facility-administered medications on file prior  to visit.   Review of Systems All otherwise neg per pt    Objective:   Physical Exam BP 126/70 (BP Location: Left Arm, Patient Position: Sitting, Cuff Size: Large)   Pulse 70   Temp 97.8 F (36.6 C) (Oral)   Ht 5\' 11"  (1.803 m)   Wt 163 lb (73.9 kg)   SpO2 97%   BMI 22.73 kg/m  VS noted,  Constitutional: Pt appears in NAD HENT: Head: NCAT.  Right Ear: External ear normal.  Left Ear: External ear normal.  Eyes: . Pupils are equal, round, and reactive to light. Conjunctivae and EOM are normal Nose: without d/c or deformity Neck: Neck supple. Gross normal ROM Cardiovascular: Normal rate and  regular rhythm.   Pulmonary/Chest: Effort normal and breath sounds without rales or wheezing.  Abd:  Soft, NT, ND, + BS, no organomegaly Neurological: Pt is alert. At baseline orientation, motor grossly intact Skin: Skin is warm. No rashes, other new lesions, no LE edema Psychiatric: Pt behavior is normal without agitation  All otherwise neg per pt Lab Results  Component Value Date   WBC 5.9 05/16/2020   HGB 13.3 05/16/2020   HCT 38.3 (L) 05/16/2020   PLT 172 05/16/2020   GLUCOSE 88 10/21/2020   CHOL 163 10/21/2020   TRIG 98.0 10/21/2020   HDL 69.70 10/21/2020   LDLDIRECT 128.8 10/19/2007   LDLCALC 74 10/21/2020   ALT 13 10/21/2020   AST 18 10/21/2020   NA 142 10/21/2020   K 4.3 10/21/2020   CL 103 10/21/2020   CREATININE 0.80 10/21/2020   BUN 16 10/21/2020   CO2 33 (H) 10/21/2020   TSH 1.77 04/09/2020   PSA 1.96 09/21/2017   HGBA1C 5.1 10/21/2020      Assessment & Plan:

## 2020-10-22 ENCOUNTER — Encounter: Payer: Self-pay | Admitting: Internal Medicine

## 2020-10-22 LAB — URINE CULTURE: Result:: NO GROWTH

## 2020-10-27 ENCOUNTER — Encounter: Payer: Self-pay | Admitting: Internal Medicine

## 2020-10-27 NOTE — Assessment & Plan Note (Signed)
stable overall by history and exam, recent data reviewed with pt, and pt to continue medical treatment as before,  to f/u any worsening symptoms or concerns  

## 2020-10-27 NOTE — Assessment & Plan Note (Addendum)
For urine studies r/o infection, but suspect oab - declines trial of med for now  I spent 31 minutes in preparing to see the patient by review of recent labs, imaging and procedures, obtaining and reviewing separately obtained history, communicating with the patient and family or caregiver, ordering medications, tests or procedures, and documenting clinical information in the EHR including the differential Dx, treatment, and any further evaluation and other management of oab, hyperglycemia, hld, htn, b12 and d deficiency

## 2020-10-27 NOTE — Assessment & Plan Note (Signed)
For oral replacement 

## 2021-01-28 DIAGNOSIS — L300 Nummular dermatitis: Secondary | ICD-10-CM | POA: Diagnosis not present

## 2021-01-28 DIAGNOSIS — L821 Other seborrheic keratosis: Secondary | ICD-10-CM | POA: Diagnosis not present

## 2021-01-31 DIAGNOSIS — L601 Onycholysis: Secondary | ICD-10-CM | POA: Diagnosis not present

## 2021-01-31 DIAGNOSIS — L300 Nummular dermatitis: Secondary | ICD-10-CM | POA: Diagnosis not present

## 2021-04-01 ENCOUNTER — Other Ambulatory Visit: Payer: Self-pay

## 2021-04-03 ENCOUNTER — Encounter: Payer: Self-pay | Admitting: Internal Medicine

## 2021-04-03 ENCOUNTER — Other Ambulatory Visit: Payer: Self-pay

## 2021-04-03 ENCOUNTER — Ambulatory Visit (INDEPENDENT_AMBULATORY_CARE_PROVIDER_SITE_OTHER): Payer: Medicare Other | Admitting: Internal Medicine

## 2021-04-03 VITALS — BP 176/80 | HR 67 | Temp 98.3°F | Ht 71.0 in | Wt 174.8 lb

## 2021-04-03 DIAGNOSIS — Z461 Encounter for fitting and adjustment of hearing aid: Secondary | ICD-10-CM | POA: Insufficient documentation

## 2021-04-03 DIAGNOSIS — I10 Essential (primary) hypertension: Secondary | ICD-10-CM

## 2021-04-03 DIAGNOSIS — R8290 Unspecified abnormal findings in urine: Secondary | ICD-10-CM

## 2021-04-03 DIAGNOSIS — H2523 Age-related cataract, morgagnian type, bilateral: Secondary | ICD-10-CM | POA: Insufficient documentation

## 2021-04-03 DIAGNOSIS — E7800 Pure hypercholesterolemia, unspecified: Secondary | ICD-10-CM | POA: Diagnosis not present

## 2021-04-03 DIAGNOSIS — E538 Deficiency of other specified B group vitamins: Secondary | ICD-10-CM | POA: Diagnosis not present

## 2021-04-03 DIAGNOSIS — I5032 Chronic diastolic (congestive) heart failure: Secondary | ICD-10-CM | POA: Diagnosis not present

## 2021-04-03 DIAGNOSIS — IMO0002 Reserved for concepts with insufficient information to code with codable children: Secondary | ICD-10-CM | POA: Insufficient documentation

## 2021-04-03 DIAGNOSIS — D2920 Benign neoplasm of unspecified testis: Secondary | ICD-10-CM | POA: Insufficient documentation

## 2021-04-03 DIAGNOSIS — D649 Anemia, unspecified: Secondary | ICD-10-CM | POA: Insufficient documentation

## 2021-04-03 DIAGNOSIS — N433 Hydrocele, unspecified: Secondary | ICD-10-CM | POA: Insufficient documentation

## 2021-04-03 DIAGNOSIS — E559 Vitamin D deficiency, unspecified: Secondary | ICD-10-CM

## 2021-04-03 DIAGNOSIS — R195 Other fecal abnormalities: Secondary | ICD-10-CM | POA: Insufficient documentation

## 2021-04-03 DIAGNOSIS — K921 Melena: Secondary | ICD-10-CM | POA: Insufficient documentation

## 2021-04-03 DIAGNOSIS — R7302 Impaired glucose tolerance (oral): Secondary | ICD-10-CM | POA: Diagnosis not present

## 2021-04-03 DIAGNOSIS — H903 Sensorineural hearing loss, bilateral: Secondary | ICD-10-CM | POA: Insufficient documentation

## 2021-04-03 LAB — HEPATIC FUNCTION PANEL
ALT: 14 U/L (ref 0–53)
AST: 17 U/L (ref 0–37)
Albumin: 4.4 g/dL (ref 3.5–5.2)
Alkaline Phosphatase: 92 U/L (ref 39–117)
Bilirubin, Direct: 0.1 mg/dL (ref 0.0–0.3)
Total Bilirubin: 0.7 mg/dL (ref 0.2–1.2)
Total Protein: 6.9 g/dL (ref 6.0–8.3)

## 2021-04-03 LAB — LIPID PANEL
Cholesterol: 141 mg/dL (ref 0–200)
HDL: 57.7 mg/dL (ref >39.00)
LDL Cholesterol: 66 mg/dL (ref 0–99)
NonHDL: 83.48
Total CHOL/HDL Ratio: 2
Triglycerides: 86 mg/dL (ref 0.0–149.0)
VLDL: 17.2 mg/dL (ref 0.0–40.0)

## 2021-04-03 LAB — CBC WITH DIFFERENTIAL/PLATELET
Basophils Absolute: 0.1 10*3/uL (ref 0.0–0.1)
Basophils Relative: 1.7 % (ref 0.0–3.0)
Eosinophils Absolute: 0.1 10*3/uL (ref 0.0–0.7)
Eosinophils Relative: 1.1 % (ref 0.0–5.0)
HCT: 41.4 % (ref 39.0–52.0)
Hemoglobin: 14.6 g/dL (ref 13.0–17.0)
Lymphocytes Relative: 21.5 % (ref 12.0–46.0)
Lymphs Abs: 1.4 10*3/uL (ref 0.7–4.0)
MCHC: 35.3 g/dL (ref 30.0–36.0)
MCV: 89.3 fl (ref 78.0–100.0)
Monocytes Absolute: 0.6 10*3/uL (ref 0.1–1.0)
Monocytes Relative: 8.7 % (ref 3.0–12.0)
Neutro Abs: 4.3 10*3/uL (ref 1.4–7.7)
Neutrophils Relative %: 67 % (ref 43.0–77.0)
Platelets: 186 10*3/uL (ref 150.0–400.0)
RBC: 4.64 Mil/uL (ref 4.22–5.81)
RDW: 13.2 % (ref 11.5–15.5)
WBC: 6.4 10*3/uL (ref 4.0–10.5)

## 2021-04-03 LAB — URINALYSIS, ROUTINE W REFLEX MICROSCOPIC
Bilirubin Urine: NEGATIVE
Ketones, ur: NEGATIVE
Leukocytes,Ua: NEGATIVE
Nitrite: NEGATIVE
RBC / HPF: NONE SEEN (ref 0–?)
Specific Gravity, Urine: 1.015 (ref 1.000–1.030)
Total Protein, Urine: NEGATIVE
Urine Glucose: NEGATIVE
Urobilinogen, UA: 0.2 (ref 0.0–1.0)
WBC, UA: NONE SEEN (ref 0–?)
pH: 7.5 (ref 5.0–8.0)

## 2021-04-03 LAB — TSH: TSH: 2.31 u[IU]/mL (ref 0.35–4.50)

## 2021-04-03 LAB — BASIC METABOLIC PANEL
BUN: 19 mg/dL (ref 6–23)
CO2: 31 mEq/L (ref 19–32)
Calcium: 9.5 mg/dL (ref 8.4–10.5)
Chloride: 104 mEq/L (ref 96–112)
Creatinine, Ser: 0.86 mg/dL (ref 0.40–1.50)
GFR: 80.3 mL/min (ref >60.00)
Glucose, Bld: 81 mg/dL (ref 70–99)
Potassium: 3.9 mEq/L (ref 3.5–5.1)
Sodium: 141 mEq/L (ref 135–145)

## 2021-04-03 LAB — VITAMIN D 25 HYDROXY (VIT D DEFICIENCY, FRACTURES): VITD: 31.14 ng/mL (ref 30.00–100.00)

## 2021-04-03 LAB — HEMOGLOBIN A1C: Hgb A1c MFr Bld: 5.1 % (ref 4.6–6.5)

## 2021-04-03 LAB — VITAMIN B12: Vitamin B-12: 139 pg/mL — ABNORMAL LOW (ref 211–911)

## 2021-04-03 NOTE — Progress Notes (Signed)
Patient ID: Connor Morgan, male   DOB: 09-Mar-1936, 85 y.o.   MRN: [id number]         Chief Complaint:: yearly exam       HPI:  Connor Morgan is a 85 y.o. male here to f/u overall doing ok, not taking Vitamin D or b12, BP at home as has been < 140/90;  Urine has had strong odor recently, but Denies urinary symptoms such as dysuria, frequency, urgency, flank pain, hematuria or n/v, fever, chills.  Does have several wks ongoing nasal allergy symptoms with clearish congestion, itch and sneezing, without fever, pain, ST, cough, swelling or wheezing.  Did also have a recent episode 1 mo ago in [place] with standing, had to hold on to the vanity for a few seconds but has not recurred.   Pt denies polydipsia, polyuria, or new focal neuro s/s.   Pt denies fever, wt loss, night sweats, loss of appetite, or other constitutional symptoms  No other new complaints.  Hands me a card with his picture, is running for [place] congress.     Wt Readings from Last 3 Encounters:  04/03/21 174 lb 12.8 oz (79.3 kg)  10/21/20 163 lb (73.9 kg)  05/20/20 172 lb 5 oz (78.2 kg)   BP Readings from Last 3 Encounters:  04/03/21 (!) 176/80  10/21/20 126/70  05/20/20 (!) 160/75   Immunization History  Administered Date(s) Administered  . Fluad Quad(high Dose 65+) 09/12/2019  . Influenza Whole 10/19/2007, 10/09/2008  . Influenza, High Dose Seasonal PF 09/21/2017, 10/09/2018  . Influenza,inj,Quad PF,6+ Mos 12/22/2013  . Influenza-Unspecified 11/03/2002, 10/01/2004, 09/06/2018  . Moderna Sars-Covid-2 Vaccination 12/29/2019, 01/24/2020  . Pneumococcal Conjugate-13 01/05/2014  . Pneumococcal Polysaccharide-23 10/19/2007  . Pneumococcal-Unspecified 12/12/2003  . Td 12/07/1994, 11/20/2008  . Tdap 05/26/2016  There are no preventive care reminders to display for this patient.    Past Medical History:  Diagnosis Date  . ALLERGIC RHINITIS 10/19/2007  . Aortic atherosclerosis ([place])   . BENIGN PROSTATIC HYPERTROPHY 10/19/2007  .  Cataract   . CHEST PAIN, ATYPICAL 12/03/2008  . DYSPNEA 11/20/2008  . ERECTILE DYSFUNCTION 10/19/2007  . FATIGUE 10/19/2007  . FREQUENCY, URINARY 11/20/2008  . Gastric ulcer   . GERD 10/19/2007  . GLUCOSE INTOLERANCE 10/19/2007  . HEMORRHOIDS, HX OF 08/13/2007  . History of kidney stones   . History of radiation exposure    [employer]  . HOH (hard of hearing)   . HYPERLIPIDEMIA 10/19/2007  . HYPERTENSION 08/13/2007  . Impaired glucose tolerance 07/08/2011  . NEPHROLITHIASIS 08/13/2007  . NEPHROLITHIASIS, HX OF 10/19/2007  . ORCHIECTOMY, HX OF 10/19/2007  . Spermatocele 08/13/2007   Past Surgical History:  Procedure Laterality Date  . COLONOSCOPY    . CYSTOSCOPY    . INGUINAL HERNIA REPAIR Bilateral   . LITHOTRIPSY    . s/p right orchiectomy     benign mass, right  . SKIN LESION EXCISION     neck  . TONSILLECTOMY    . UPPER GI ENDOSCOPY    . URETEROSCOPY WITH HOLMIUM LASER LITHOTRIPSY Left 05/20/2020   Procedure: URETEROSCOPY WITH HOLMIUM LASER LITHOTRIPSY/ RETROGRADE/ STENT PLACEMENT;  Surgeon: Raynelle Bring, MD;  Location: [facility];  Service: Urology;  Laterality: Left;  ONLY NEEDS 60 MIN    reports that he has quit smoking. He has never used smokeless tobacco. He reports previous alcohol use. He reports that he does not use drugs. family history includes Dementia in his father and mother; Heart disease in his brother and  father; Kidney disease in his mother. Allergies  Allergen Reactions  . Mevacor [Lovastatin]     REACTION: myalgias   Current Outpatient Medications on File Prior to Visit  Medication Sig Dispense Refill  . amLODipine (NORVASC) 5 MG tablet Take 1 tablet (5 mg total) by mouth daily. 90 tablet 3  . aspirin 81 MG tablet Take 81 mg by mouth daily.    Marland Kitchen atorvastatin (LIPITOR) 10 MG tablet Take 1 tablet (10 mg total) by mouth daily. 90 tablet 3  . HYDROcodone-acetaminophen (NORCO/VICODIN) 5-325 MG tablet Take 1-2 tablets by mouth every 6 (six) hours.    Marland Kitchen  HYDROcodone-acetaminophen (NORCO/VICODIN) 5-325 MG tablet Take 1-2 tablets by mouth every 6 (six) hours as needed. 15 tablet 0  . ondansetron (ZOFRAN) 4 MG tablet Take 4 mg by mouth every 8 (eight) hours as needed.    Marland Kitchen Propylene Glycol 0.6 % SOLN Place 1 drop into both eyes 4 (four) times daily as needed (dry eyes).    . tamsulosin (FLOMAX) 0.4 MG CAPS capsule Take 0.4 mg by mouth at bedtime.    . vitamin B-12 (CYANOCOBALAMIN) 1000 MCG tablet Take 1 tablet (1,000 mcg total) by mouth daily. 90 tablet 3  . triamcinolone cream (KENALOG) 0.5 % Apply topically 2 (two) times daily.     No current facility-administered medications on file prior to visit.        ROS:  All others reviewed and negative.  Objective        PE:  BP (!) 176/80 (BP Location: Left Arm, Patient Position: Sitting, Cuff Size: Normal)   Pulse 67   Temp 98.3 F (36.8 C) (Oral)   Ht 5\' 11"  (1.803 m)   Wt 174 lb 12.8 oz (79.3 kg)   SpO2 96%   BMI 24.38 kg/m                 Constitutional: Pt appears in NAD               HENT: Head: NCAT.                Right Ear: External ear normal.                 Left Ear: External ear normal.                Eyes: . Pupils are equal, round, and reactive to light. Conjunctivae and EOM are normal               Nose: without d/c or deformity               Neck: Neck supple. Gross normal ROM               Cardiovascular: Normal rate and regular rhythm.                 Pulmonary/Chest: Effort normal and breath sounds without rales or wheezing.                Abd:  Soft, NT, ND, + BS, no organomegaly               Neurological: Pt is alert. At baseline orientation, motor grossly intact               Skin: Skin is warm. No rashes, no other new lesions, LE edema - none               Psychiatric: Pt behavior is normal without agitation  Micro: none  Cardiac tracings I have personally interpreted today:  none  Pertinent Radiological findings (summarize): none   Lab Results  Component  Value Date   WBC 6.4 04/03/2021   HGB 14.6 04/03/2021   HCT 41.4 04/03/2021   PLT 186.0 04/03/2021   GLUCOSE 81 04/03/2021   CHOL 141 04/03/2021   TRIG 86.0 04/03/2021   HDL 57.70 04/03/2021   LDLDIRECT 128.8 10/19/2007   LDLCALC 66 04/03/2021   ALT 14 04/03/2021   AST 17 04/03/2021   NA 141 04/03/2021   K 3.9 04/03/2021   CL 104 04/03/2021   CREATININE 0.86 04/03/2021   BUN 19 04/03/2021   CO2 31 04/03/2021   TSH 2.31 04/03/2021   PSA 1.96 09/21/2017   HGBA1C 5.1 04/03/2021   Assessment/Plan:  Connor Morgan is a 85 y.o. White or Caucasian [1] male with  has a past medical history of ALLERGIC RHINITIS (10/19/2007), Aortic atherosclerosis ([place]), BENIGN PROSTATIC HYPERTROPHY (10/19/2007), Cataract, CHEST PAIN, ATYPICAL (12/03/2008), DYSPNEA (11/20/2008), ERECTILE DYSFUNCTION (10/19/2007), FATIGUE (10/19/2007), FREQUENCY, URINARY (11/20/2008), Gastric ulcer, GERD (10/19/2007), GLUCOSE INTOLERANCE (10/19/2007), HEMORRHOIDS, HX OF (08/13/2007), History of kidney stones, History of radiation exposure, HOH (hard of hearing), HYPERLIPIDEMIA (10/19/2007), HYPERTENSION (08/13/2007), Impaired glucose tolerance (07/08/2011), NEPHROLITHIASIS (08/13/2007), NEPHROLITHIASIS, HX OF (10/19/2007), ORCHIECTOMY, HX OF (10/19/2007), and Spermatocele (08/13/2007).  Vitamin D deficiency Last vitamin D Lab Results  Component Value Date   VD25OH 31.14 04/03/2021   Low normal, to start oral replacement  Diastolic dysfunction with chronic heart failure (HCC) Stable, to continue current med tx - not requiring diuretic at this time   Impaired glucose tolerance Lab Results  Component Value Date   HGBA1C 5.1 04/03/2021   Stable, pt to continue current medical treatment  - diet and wt control   HLD (hyperlipidemia) Lab Results  Component Value Date   LDLCALC 66 04/03/2021   Stable, pt to continue current statin  - liptior 10   Essential hypertension BP Readings from Last 3 Encounters:  04/03/21 (!)  176/80  10/21/20 126/70  05/20/20 (!) 160/75   Stable as pt is adamant BP at home is < 140/90, pt to continue medical treatment norvasc   Current Outpatient Medications (Cardiovascular):  .  amLODipine (NORVASC) 5 MG tablet, Take 1 tablet (5 mg total) by mouth daily. Marland Kitchen  atorvastatin (LIPITOR) 10 MG tablet, Take 1 tablet (10 mg total) by mouth daily.   Current Outpatient Medications (Analgesics):  .  aspirin 81 MG tablet, Take 81 mg by mouth daily. Marland Kitchen  HYDROcodone-acetaminophen (NORCO/VICODIN) 5-325 MG tablet, Take 1-2 tablets by mouth every 6 (six) hours. Marland Kitchen  HYDROcodone-acetaminophen (NORCO/VICODIN) 5-325 MG tablet, Take 1-2 tablets by mouth every 6 (six) hours as needed.  Current Outpatient Medications (Hematological):  .  vitamin B-12 (CYANOCOBALAMIN) 1000 MCG tablet, Take 1 tablet (1,000 mcg total) by mouth daily.  Current Outpatient Medications (Other):  .  ondansetron (ZOFRAN) 4 MG tablet, Take 4 mg by mouth every 8 (eight) hours as needed. Marland Kitchen  Propylene Glycol 0.6 % SOLN, Place 1 drop into both eyes 4 (four) times daily as needed (dry eyes). .  tamsulosin (FLOMAX) 0.4 MG CAPS capsule, Take 0.4 mg by mouth at bedtime. .  triamcinolone cream (KENALOG) 0.5 %, Apply topically 2 (two) times daily.   B12 deficiency Lab Results  Component Value Date   VITAMINB12 139 (L) 04/03/2021   Low, to start oral replacement - b12 1000 mcg qd   Abnormal urine odor Also for urine culture, but exam o/w benign  Followup: Return in about 6 months (around 10/03/2021).  Cathlean Cower, MD 04/06/2021 12:33 AM [facility]

## 2021-04-03 NOTE — Patient Instructions (Signed)

## 2021-04-04 ENCOUNTER — Encounter: Payer: Self-pay | Admitting: Internal Medicine

## 2021-04-04 LAB — URINE CULTURE: Result:: NO GROWTH

## 2021-04-06 ENCOUNTER — Encounter: Payer: Self-pay | Admitting: Internal Medicine

## 2021-04-06 DIAGNOSIS — R8290 Unspecified abnormal findings in urine: Secondary | ICD-10-CM | POA: Insufficient documentation

## 2021-04-06 NOTE — Assessment & Plan Note (Signed)
Lab Results  Component Value Date   LDLCALC 66 04/03/2021   Stable, pt to continue current statin  - liptior 10

## 2021-04-06 NOTE — Assessment & Plan Note (Signed)
Stable, to continue current med tx - not requiring diuretic at this time

## 2021-04-06 NOTE — Assessment & Plan Note (Signed)
Lab Results  Component Value Date   VITAMINB12 139 (L) 04/03/2021   Low, to start oral replacement - b12 1000 mcg qd

## 2021-04-06 NOTE — Assessment & Plan Note (Signed)
Also for urine culture, but exam o/w benign

## 2021-04-06 NOTE — Assessment & Plan Note (Signed)
Lab Results  Component Value Date   HGBA1C 5.1 04/03/2021   Stable, pt to continue current medical treatment  - diet and wt control

## 2021-04-06 NOTE — Assessment & Plan Note (Signed)
Last vitamin D Lab Results  Component Value Date   VD25OH 31.14 04/03/2021   Low normal, to start oral replacement

## 2021-04-06 NOTE — Assessment & Plan Note (Signed)
BP Readings from Last 3 Encounters:  04/03/21 (!) 176/80  10/21/20 126/70  05/20/20 (!) 160/75   Stable as pt is adamant BP at home is < 140/90, pt to continue medical treatment norvasc   Current Outpatient Medications (Cardiovascular):  .  amLODipine (NORVASC) 5 MG tablet, Take 1 tablet (5 mg total) by mouth daily. Marland Kitchen  atorvastatin (LIPITOR) 10 MG tablet, Take 1 tablet (10 mg total) by mouth daily.   Current Outpatient Medications (Analgesics):  .  aspirin 81 MG tablet, Take 81 mg by mouth daily. Marland Kitchen  HYDROcodone-acetaminophen (NORCO/VICODIN) 5-325 MG tablet, Take 1-2 tablets by mouth every 6 (six) hours. Marland Kitchen  HYDROcodone-acetaminophen (NORCO/VICODIN) 5-325 MG tablet, Take 1-2 tablets by mouth every 6 (six) hours as needed.  Current Outpatient Medications (Hematological):  .  vitamin B-12 (CYANOCOBALAMIN) 1000 MCG tablet, Take 1 tablet (1,000 mcg total) by mouth daily.  Current Outpatient Medications (Other):  .  ondansetron (ZOFRAN) 4 MG tablet, Take 4 mg by mouth every 8 (eight) hours as needed. Marland Kitchen  Propylene Glycol 0.6 % SOLN, Place 1 drop into both eyes 4 (four) times daily as needed (dry eyes). .  tamsulosin (FLOMAX) 0.4 MG CAPS capsule, Take 0.4 mg by mouth at bedtime. .  triamcinolone cream (KENALOG) 0.5 %, Apply topically 2 (two) times daily.

## 2021-04-21 ENCOUNTER — Ambulatory Visit: Payer: Medicare Other | Admitting: Internal Medicine

## 2021-04-27 DIAGNOSIS — S60921A Unspecified superficial injury of right hand, initial encounter: Secondary | ICD-10-CM | POA: Diagnosis not present

## 2021-04-30 ENCOUNTER — Other Ambulatory Visit: Payer: Self-pay | Admitting: Internal Medicine

## 2021-04-30 NOTE — Telephone Encounter (Signed)
Please refill as per office routine med refill policy (all routine meds refilled for 3 mo or monthly per pt preference up to one year from last visit, then month to month grace period for 3 mo, then further med refills will have to be denied)  

## 2021-05-07 DIAGNOSIS — S60511A Abrasion of right hand, initial encounter: Secondary | ICD-10-CM | POA: Diagnosis not present

## 2021-05-07 DIAGNOSIS — L089 Local infection of the skin and subcutaneous tissue, unspecified: Secondary | ICD-10-CM | POA: Diagnosis not present

## 2021-05-27 DIAGNOSIS — L820 Inflamed seborrheic keratosis: Secondary | ICD-10-CM | POA: Diagnosis not present

## 2021-05-27 DIAGNOSIS — L578 Other skin changes due to chronic exposure to nonionizing radiation: Secondary | ICD-10-CM | POA: Diagnosis not present

## 2021-05-27 DIAGNOSIS — L821 Other seborrheic keratosis: Secondary | ICD-10-CM | POA: Diagnosis not present

## 2021-09-30 DIAGNOSIS — L570 Actinic keratosis: Secondary | ICD-10-CM | POA: Diagnosis not present

## 2021-09-30 DIAGNOSIS — L821 Other seborrheic keratosis: Secondary | ICD-10-CM | POA: Diagnosis not present

## 2021-09-30 DIAGNOSIS — L578 Other skin changes due to chronic exposure to nonionizing radiation: Secondary | ICD-10-CM | POA: Diagnosis not present

## 2021-09-30 DIAGNOSIS — L300 Nummular dermatitis: Secondary | ICD-10-CM | POA: Diagnosis not present

## 2021-10-03 ENCOUNTER — Other Ambulatory Visit: Payer: Self-pay

## 2021-10-03 ENCOUNTER — Encounter: Payer: Self-pay | Admitting: Internal Medicine

## 2021-10-03 ENCOUNTER — Ambulatory Visit (INDEPENDENT_AMBULATORY_CARE_PROVIDER_SITE_OTHER): Payer: Medicare Other | Admitting: Internal Medicine

## 2021-10-03 VITALS — BP 138/68 | HR 71 | Ht 71.0 in | Wt 174.0 lb

## 2021-10-03 DIAGNOSIS — I10 Essential (primary) hypertension: Secondary | ICD-10-CM

## 2021-10-03 DIAGNOSIS — Z23 Encounter for immunization: Secondary | ICD-10-CM

## 2021-10-03 DIAGNOSIS — K219 Gastro-esophageal reflux disease without esophagitis: Secondary | ICD-10-CM

## 2021-10-03 DIAGNOSIS — E559 Vitamin D deficiency, unspecified: Secondary | ICD-10-CM

## 2021-10-03 DIAGNOSIS — E538 Deficiency of other specified B group vitamins: Secondary | ICD-10-CM

## 2021-10-03 DIAGNOSIS — R1319 Other dysphagia: Secondary | ICD-10-CM

## 2021-10-03 DIAGNOSIS — E7800 Pure hypercholesterolemia, unspecified: Secondary | ICD-10-CM

## 2021-10-03 DIAGNOSIS — R7302 Impaired glucose tolerance (oral): Secondary | ICD-10-CM

## 2021-10-03 MED ORDER — PANTOPRAZOLE SODIUM 40 MG PO TBEC: 40.0000 mg | DELAYED_RELEASE_TABLET | Freq: Every day | ORAL | 3 refills | Status: AC

## 2021-10-03 NOTE — Patient Instructions (Addendum)
Please take OTC Vitamin D3 at 2000 units per day, indefinitely  Please take the OTC B12 1000 mcg per day  Please take all new medication as prescribed - the protonix for acid reflux  Please continue all other medications as before, and refills have been done if requested.  Please have the pharmacy call with any other refills you may need.  Please continue your efforts at being more active, low cholesterol diet, and weight control.  Please keep your appointments with your specialists as you may have planned  You will be contacted regarding the referral for: Dr Danis/GI for the trouble swallowing, but if it is improved with the protonix, you may not need to see Dr Loletha Carrow  We can hold on more lab testing for now  Please make an Appointment to return in 6 months, or sooner if needed

## 2021-10-03 NOTE — Assessment & Plan Note (Signed)
Last vitamin D Lab Results  Component Value Date   VD25OH 31.14 04/03/2021   Low, to start oral replacement

## 2021-10-03 NOTE — Assessment & Plan Note (Signed)
Lab Results  Component Value Date   VITAMINB12 139 (L) 04/03/2021   Low, to start oral replacement - b12 1000 mcg qd

## 2021-10-03 NOTE — Progress Notes (Signed)
Patient ID: Connor Morgan, male   DOB: 04-11-1936, 85 y.o.   MRN: [id number]        Chief Complaint: follow up HTN, HLD and hyperglycemia, low vit d, dysphagia       HPI:  Connor Morgan is a 85 y.o. male here overall doing ok with stable wt, but now with 2 mo worsening difficulty swallowing at the lower sternal area with a hangup it seems, but no vomiting, nausea but has mild worsening reflux as well.  Not taking PPI.  Denies worsening other abd pain, n/v, bowel change or blood. Pt denies chest pain, increased sob or doe, wheezing, orthopnea, PND, increased LE swelling, palpitations, dizziness or syncope.   Pt denies polydipsia, polyuria, or low sugar symptoms such as weakness or confusion improved with po intake.  Pt states overall good compliance with meds, trying to follow lower cholesterol, diabetic diet, wt overall stable but little exercise however.   Had 2 areas right face with precancerous lesion burned off per derm.  S/p left cataract done about 1 mo ago, due for right soon.  Has heading aids, but forgets to put them in, from the [place].  Also not taking vit d or b12 for the most part, though he meant to start, just didn't do it.   Wt Readings from Last 3 Encounters:  10/03/21 174 lb (78.9 kg)  04/03/21 174 lb 12.8 oz (79.3 kg)  10/21/20 163 lb (73.9 kg)   BP Readings from Last 3 Encounters:  10/03/21 138/68  04/03/21 (!) 176/80  10/21/20 126/70         Past Medical History:  Diagnosis Date   ALLERGIC RHINITIS 10/19/2007   Aortic atherosclerosis ([place])    BENIGN PROSTATIC HYPERTROPHY 10/19/2007   Cataract    CHEST PAIN, ATYPICAL 12/03/2008   DYSPNEA 11/20/2008   ERECTILE DYSFUNCTION 10/19/2007   FATIGUE 10/19/2007   FREQUENCY, URINARY 11/20/2008   Gastric ulcer    GERD 10/19/2007   GLUCOSE INTOLERANCE 10/19/2007   HEMORRHOIDS, HX OF 08/13/2007   History of kidney stones    History of radiation exposure    [employer]   HOH (hard of hearing)    HYPERLIPIDEMIA 10/19/2007    HYPERTENSION 08/13/2007   Impaired glucose tolerance 07/08/2011   NEPHROLITHIASIS 08/13/2007   NEPHROLITHIASIS, HX OF 10/19/2007   ORCHIECTOMY, HX OF 10/19/2007   Spermatocele 08/13/2007   Past Surgical History:  Procedure Laterality Date   COLONOSCOPY     CYSTOSCOPY     INGUINAL HERNIA REPAIR Bilateral    LITHOTRIPSY     s/p right orchiectomy     benign mass, right   SKIN LESION EXCISION     neck   TONSILLECTOMY     UPPER GI ENDOSCOPY     URETEROSCOPY WITH HOLMIUM LASER LITHOTRIPSY Left 05/20/2020   Procedure: URETEROSCOPY WITH HOLMIUM LASER LITHOTRIPSY/ RETROGRADE/ STENT PLACEMENT;  Surgeon: Raynelle Bring, MD;  Location: [facility];  Service: Urology;  Laterality: Left;  ONLY NEEDS 60 MIN    reports that he has quit smoking. He has never used smokeless tobacco. He reports that he does not currently use alcohol. He reports that he does not use drugs. family history includes Dementia in his father and mother; Heart disease in his brother and father; Kidney disease in his mother. Allergies  Allergen Reactions   Mevacor [Lovastatin]     REACTION: myalgias   Current Outpatient Medications on File Prior to Visit  Medication Sig Dispense Refill   amLODipine (NORVASC) 5 MG tablet  Take 1 tablet by mouth once daily 90 tablet 0   aspirin 81 MG tablet Take 81 mg by mouth daily.     atorvastatin (LIPITOR) 10 MG tablet Take 1 tablet (10 mg total) by mouth daily. 90 tablet 3   ondansetron (ZOFRAN) 4 MG tablet Take 4 mg by mouth every 8 (eight) hours as needed.     Propylene Glycol 0.6 % SOLN Place 1 drop into both eyes 4 (four) times daily as needed (dry eyes).     tamsulosin (FLOMAX) 0.4 MG CAPS capsule Take 0.4 mg by mouth at bedtime.     triamcinolone cream (KENALOG) 0.5 % Apply topically 2 (two) times daily.     vitamin B-12 (CYANOCOBALAMIN) 1000 MCG tablet Take 1 tablet (1,000 mcg total) by mouth daily. 90 tablet 3   HYDROcodone-acetaminophen (NORCO/VICODIN) 5-325 MG tablet Take 1-2 tablets by  mouth every 6 (six) hours. (Patient not taking: Reported on 10/03/2021)     HYDROcodone-acetaminophen (NORCO/VICODIN) 5-325 MG tablet Take 1-2 tablets by mouth every 6 (six) hours as needed. (Patient not taking: Reported on 10/03/2021) 15 tablet 0   No current facility-administered medications on file prior to visit.        ROS:  All others reviewed and negative.  Objective        PE:  BP 138/68 (BP Location: Right Arm, Patient Position: Sitting, Cuff Size: Large)   Pulse 71   Ht 5\' 11"  (1.803 m)   Wt 174 lb (78.9 kg)   SpO2 98%   BMI 24.27 kg/m                 Constitutional: Pt appears in NAD               HENT: Head: NCAT.                Right Ear: External ear normal.                 Left Ear: External ear normal.                Eyes: . Pupils are equal, round, and reactive to light. Conjunctivae and EOM are normal               Nose: without d/c or deformity               Neck: Neck supple. Gross normal ROM               Cardiovascular: Normal rate and regular rhythm.                 Pulmonary/Chest: Effort normal and breath sounds without rales or wheezing.                Abd:  Soft, NT, ND, + BS, no organomegaly               Neurological: Pt is alert. At baseline orientation, motor grossly intact               Skin: Skin is warm. No rashes, no other new lesions, LE edema - none               Psychiatric: Pt behavior is normal without agitation   Micro: none  Cardiac tracings I have personally interpreted today:  none  Pertinent Radiological findings (summarize): none   Lab Results  Component Value Date   WBC 6.4 04/03/2021   HGB 14.6 04/03/2021   HCT 41.4 04/03/2021  PLT 186.0 04/03/2021   GLUCOSE 81 04/03/2021   CHOL 141 04/03/2021   TRIG 86.0 04/03/2021   HDL 57.70 04/03/2021   LDLDIRECT 128.8 10/19/2007   LDLCALC 66 04/03/2021   ALT 14 04/03/2021   AST 17 04/03/2021   NA 141 04/03/2021   K 3.9 04/03/2021   CL 104 04/03/2021   CREATININE 0.86  04/03/2021   BUN 19 04/03/2021   CO2 31 04/03/2021   TSH 2.31 04/03/2021   PSA 1.96 09/21/2017   HGBA1C 5.1 04/03/2021   Assessment/Plan:  Connor Morgan is a 85 y.o. White or Caucasian [1] male with  has a past medical history of ALLERGIC RHINITIS (10/19/2007), Aortic atherosclerosis ([place]), BENIGN PROSTATIC HYPERTROPHY (10/19/2007), Cataract, CHEST PAIN, ATYPICAL (12/03/2008), DYSPNEA (11/20/2008), ERECTILE DYSFUNCTION (10/19/2007), FATIGUE (10/19/2007), FREQUENCY, URINARY (11/20/2008), Gastric ulcer, GERD (10/19/2007), GLUCOSE INTOLERANCE (10/19/2007), HEMORRHOIDS, HX OF (08/13/2007), History of kidney stones, History of radiation exposure, HOH (hard of hearing), HYPERLIPIDEMIA (10/19/2007), HYPERTENSION (08/13/2007), Impaired glucose tolerance (07/08/2011), NEPHROLITHIASIS (08/13/2007), NEPHROLITHIASIS, HX OF (10/19/2007), ORCHIECTOMY, HX OF (10/19/2007), and Spermatocele (08/13/2007).  B12 deficiency Lab Results  Component Value Date   VITAMINB12 139 (L) 04/03/2021   Low, to start oral replacement - b12 1000 mcg qd   Vitamin D deficiency Last vitamin D Lab Results  Component Value Date   VD25OH 31.14 04/03/2021   Low, to start oral replacement   Dysphagia Mild recent worsening, for PPI, and refer GI for possible EGD  HLD (hyperlipidemia) Lab Results  Component Value Date   LDLCALC 66 04/03/2021   Stable, pt to continue current statin lipitor   Essential hypertension BP Readings from Last 3 Encounters:  10/03/21 138/68  04/03/21 (!) 176/80  10/21/20 126/70   Stable, pt to continue medical treatment norvasc   GERD Uncontrolled mild worsening recently, for PPI  Impaired glucose tolerance Lab Results  Component Value Date   HGBA1C 5.1 04/03/2021   Stable, pt to continue current medical treatment  - diet Followup: Return in about 6 months (around 04/03/2022).  Cathlean Cower, MD 10/05/2021 6:50 PM [facility]

## 2021-10-05 ENCOUNTER — Encounter: Payer: Self-pay | Admitting: Internal Medicine

## 2021-10-05 NOTE — Assessment & Plan Note (Signed)
Lab Results  Component Value Date   HGBA1C 5.1 04/03/2021   Stable, pt to continue current medical treatment  - diet

## 2021-10-05 NOTE — Assessment & Plan Note (Signed)
Uncontrolled mild worsening recently, for PPI

## 2021-10-05 NOTE — Assessment & Plan Note (Signed)
Mild recent worsening, for PPI, and refer GI for possible EGD

## 2021-10-05 NOTE — Assessment & Plan Note (Signed)
Lab Results  Component Value Date   LDLCALC 66 04/03/2021   Stable, pt to continue current statin lipitor

## 2021-10-05 NOTE — Assessment & Plan Note (Signed)
BP Readings from Last 3 Encounters:  10/03/21 138/68  04/03/21 (!) 176/80  10/21/20 126/70   Stable, pt to continue medical treatment norvasc

## 2021-12-02 ENCOUNTER — Other Ambulatory Visit: Payer: Self-pay | Admitting: Internal Medicine

## 2021-12-02 NOTE — Telephone Encounter (Signed)
Please refill as per office routine med refill policy (all routine meds to be refilled for 3 mo or monthly (per pt preference) up to one year from last visit, then month to month grace period for 3 mo, then further med refills will have to be denied) ? ?

## 2021-12-13 IMAGING — DX DG ABDOMEN ACUTE W/ 1V CHEST
3 series · 3 of 3 positions shown · non-contrast
Comparison: Chest radiograph 08/02/2018

CLINICAL DATA: Left flank pain, passed kidney stone 2 days ago.

EXAM:
DG ABDOMEN ACUTE W/ 1V CHEST

[chest pa]
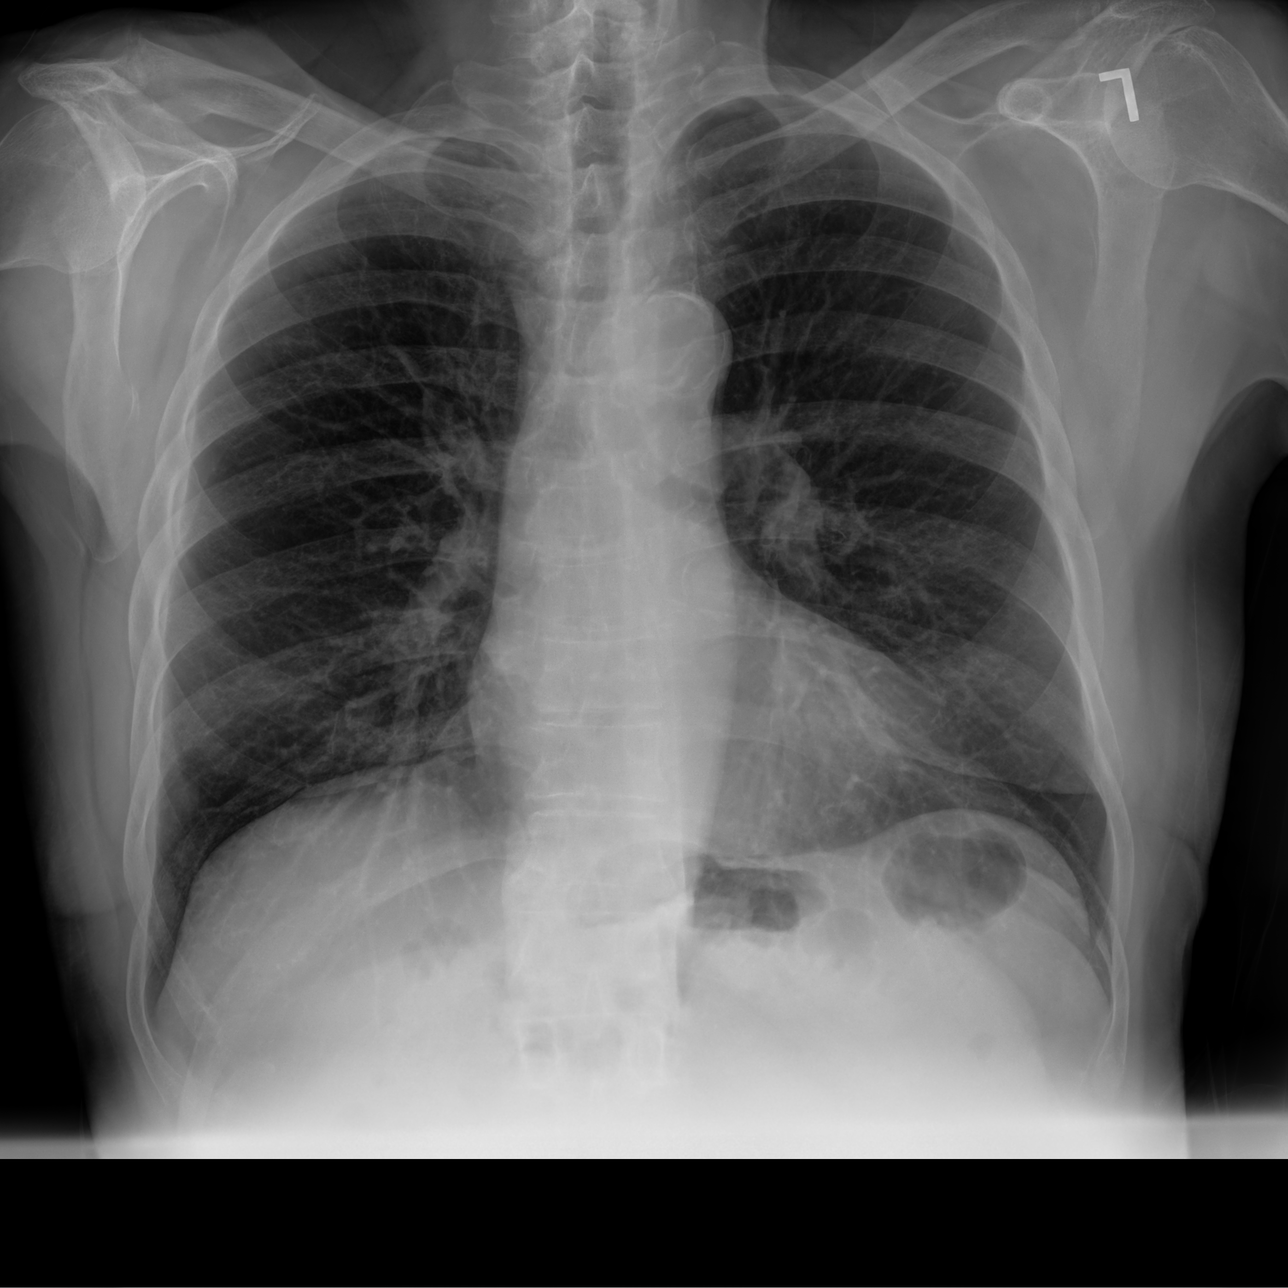

[abdomen standing ap]
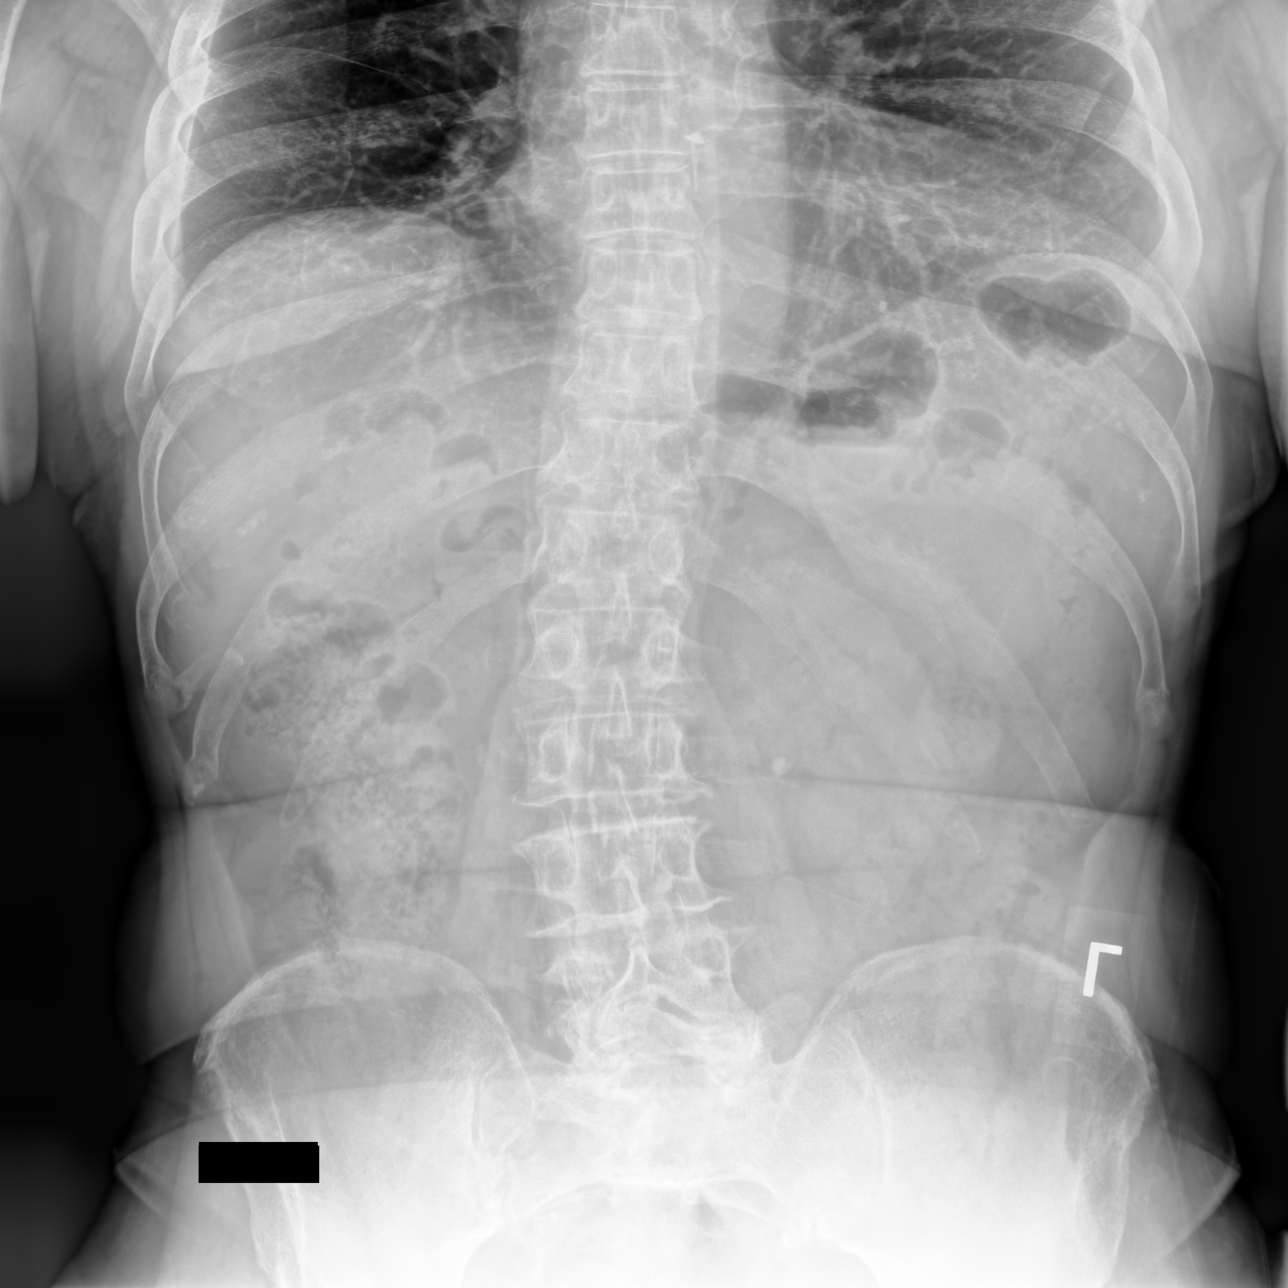

[abdomen supine ap]
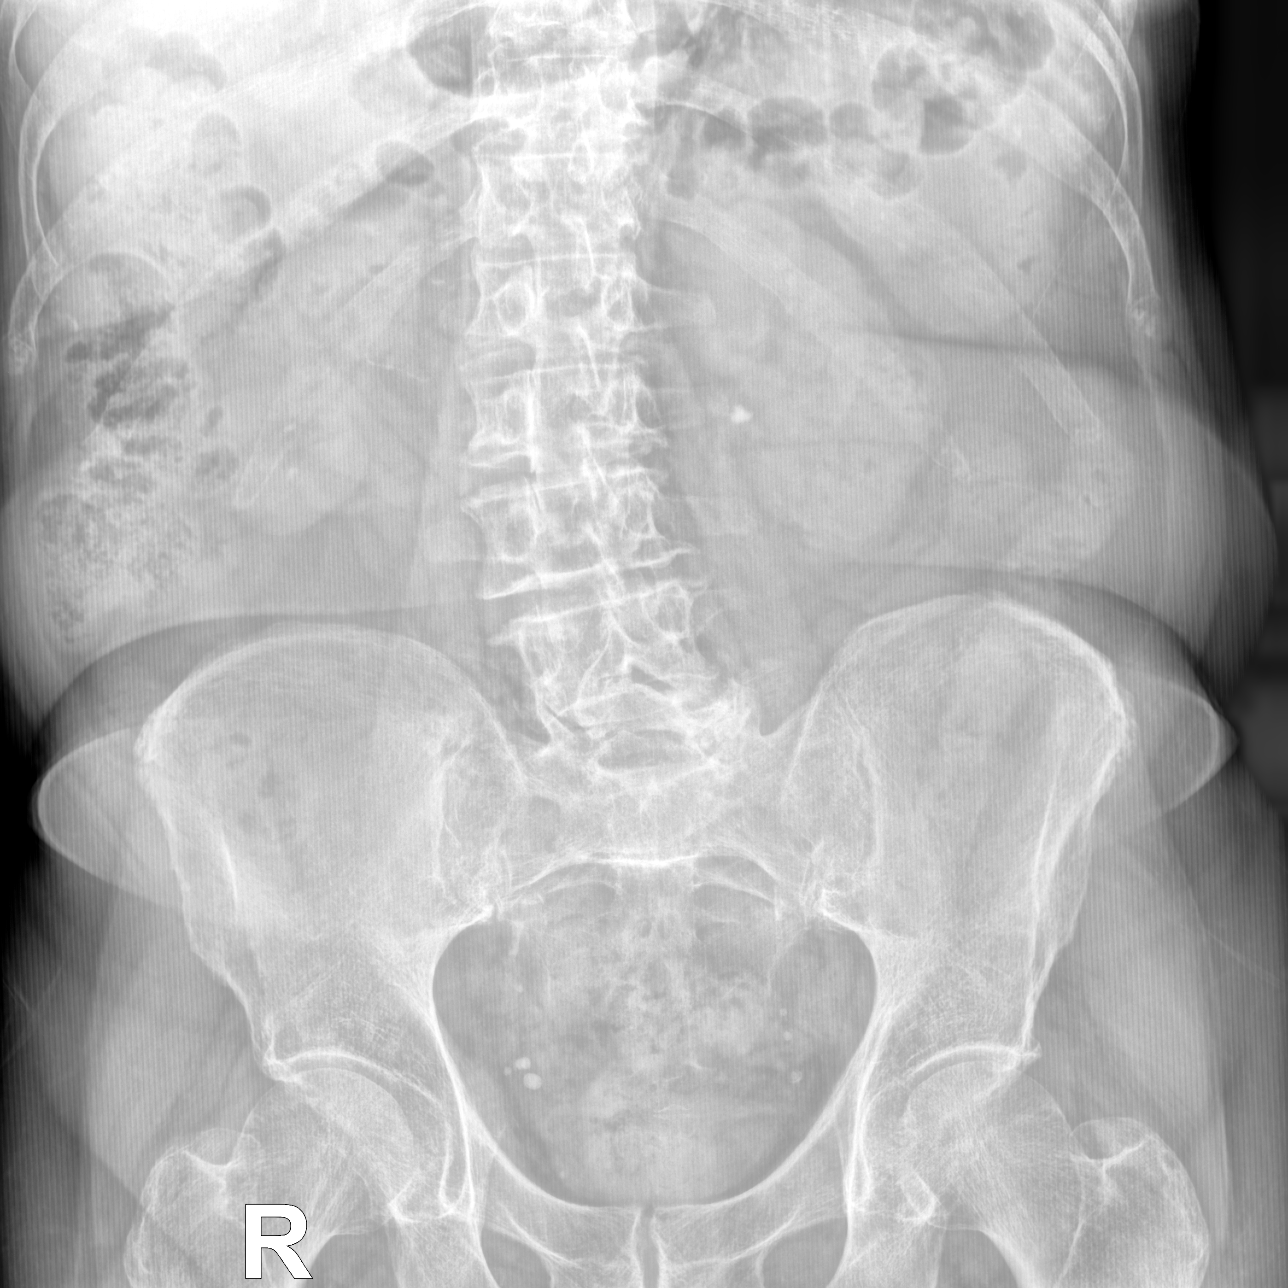

[3 of 3 positions shown; findings below may reference images not displayed]

FINDINGS: Heart is normal in size. Aortic atherosclerosis. Normal mediastinal
contours. No focal airspace disease or pleural effusion.

A 5 mm stone projects over the left renal pelvis. Possible
nonobstructing stones projecting over the lower right kidney,
obscured by overlying stool. There are multiple pelvic
calcifications, typical of phleboliths but nonspecific.
Nonobstructive bowel gas pattern with small to moderate stool
burden. No free air. Scoliotic curvature in the spine with
degenerative change. No acute osseous abnormalities are seen.
IMPRESSION: 1. A 5 mm stone projects over the left renal pelvis.
2. Possible nonobstructing stones projecting over the lower right
kidney, obscured by overlying stool.
3.  Aortic Atherosclerosis ([id number]-UVA.A).

## 2022-02-16 DIAGNOSIS — S61411A Laceration without foreign body of right hand, initial encounter: Secondary | ICD-10-CM | POA: Diagnosis not present

## 2022-03-31 DIAGNOSIS — R233 Spontaneous ecchymoses: Secondary | ICD-10-CM | POA: Diagnosis not present

## 2022-03-31 DIAGNOSIS — L570 Actinic keratosis: Secondary | ICD-10-CM | POA: Diagnosis not present

## 2022-03-31 DIAGNOSIS — L578 Other skin changes due to chronic exposure to nonionizing radiation: Secondary | ICD-10-CM | POA: Diagnosis not present

## 2022-04-29 ENCOUNTER — Encounter: Payer: Medicare Other | Admitting: Internal Medicine

## 2022-04-29 ENCOUNTER — Ambulatory Visit (INDEPENDENT_AMBULATORY_CARE_PROVIDER_SITE_OTHER): Payer: Medicare Other | Admitting: Internal Medicine

## 2022-04-29 VITALS — BP 126/82 | HR 65 | Ht 71.0 in | Wt 171.0 lb

## 2022-04-29 DIAGNOSIS — E538 Deficiency of other specified B group vitamins: Secondary | ICD-10-CM | POA: Diagnosis not present

## 2022-04-29 DIAGNOSIS — R7302 Impaired glucose tolerance (oral): Secondary | ICD-10-CM | POA: Diagnosis not present

## 2022-04-29 DIAGNOSIS — R21 Rash and other nonspecific skin eruption: Secondary | ICD-10-CM | POA: Diagnosis not present

## 2022-04-29 DIAGNOSIS — E559 Vitamin D deficiency, unspecified: Secondary | ICD-10-CM | POA: Diagnosis not present

## 2022-04-29 DIAGNOSIS — E7800 Pure hypercholesterolemia, unspecified: Secondary | ICD-10-CM | POA: Diagnosis not present

## 2022-04-29 LAB — CBC WITH DIFFERENTIAL/PLATELET
Basophils Absolute: 0.1 10*3/uL (ref 0.0–0.1)
Basophils Relative: 1.7 % (ref 0.0–3.0)
Eosinophils Absolute: 0.2 10*3/uL (ref 0.0–0.7)
Eosinophils Relative: 3.8 % (ref 0.0–5.0)
HCT: 41.2 % (ref 39.0–52.0)
Hemoglobin: 14.4 g/dL (ref 13.0–17.0)
Lymphocytes Relative: 24.4 % (ref 12.0–46.0)
Lymphs Abs: 1.4 10*3/uL (ref 0.7–4.0)
MCHC: 34.9 g/dL (ref 30.0–36.0)
MCV: 90.9 fl (ref 78.0–100.0)
Monocytes Absolute: 0.5 10*3/uL (ref 0.1–1.0)
Monocytes Relative: 8.8 % (ref 3.0–12.0)
Neutro Abs: 3.6 10*3/uL (ref 1.4–7.7)
Neutrophils Relative %: 61.3 % (ref 43.0–77.0)
Platelets: 197 10*3/uL (ref 150.0–400.0)
RBC: 4.53 Mil/uL (ref 4.22–5.81)
RDW: 13.3 % (ref 11.5–15.5)
WBC: 5.8 10*3/uL (ref 4.0–10.5)

## 2022-04-29 LAB — LIPID PANEL
Cholesterol: 142 mg/dL (ref 0–200)
HDL: 55.6 mg/dL (ref >39.00)
LDL Cholesterol: 71 mg/dL (ref 0–99)
NonHDL: 86.37
Total CHOL/HDL Ratio: 3
Triglycerides: 78 mg/dL (ref 0.0–149.0)
VLDL: 15.6 mg/dL (ref 0.0–40.0)

## 2022-04-29 LAB — URINALYSIS, ROUTINE W REFLEX MICROSCOPIC
Bilirubin Urine: NEGATIVE
Hgb urine dipstick: NEGATIVE
Ketones, ur: NEGATIVE
Leukocytes,Ua: NEGATIVE
Nitrite: NEGATIVE
RBC / HPF: NONE SEEN (ref 0–?)
Specific Gravity, Urine: 1.015 (ref 1.000–1.030)
Total Protein, Urine: NEGATIVE
Urine Glucose: NEGATIVE
Urobilinogen, UA: 0.2 (ref 0.0–1.0)
pH: 7.5 (ref 5.0–8.0)

## 2022-04-29 LAB — HEPATIC FUNCTION PANEL
ALT: 18 U/L (ref 0–53)
AST: 20 U/L (ref 0–37)
Albumin: 4.5 g/dL (ref 3.5–5.2)
Alkaline Phosphatase: 98 U/L (ref 39–117)
Bilirubin, Direct: 0.2 mg/dL (ref 0.0–0.3)
Total Bilirubin: 0.7 mg/dL (ref 0.2–1.2)
Total Protein: 7 g/dL (ref 6.0–8.3)

## 2022-04-29 LAB — VITAMIN B12: Vitamin B-12: >1504 pg/mL — ABNORMAL HIGH (ref 211–911)

## 2022-04-29 LAB — BASIC METABOLIC PANEL
BUN: 14 mg/dL (ref 6–23)
CO2: 32 mEq/L (ref 19–32)
Calcium: 9.6 mg/dL (ref 8.4–10.5)
Chloride: 105 mEq/L (ref 96–112)
Creatinine, Ser: 0.85 mg/dL (ref 0.40–1.50)
GFR: 79.98 mL/min (ref >60.00)
Glucose, Bld: 85 mg/dL (ref 70–99)
Potassium: 3.7 mEq/L (ref 3.5–5.1)
Sodium: 142 mEq/L (ref 135–145)

## 2022-04-29 LAB — TSH: TSH: 1.99 u[IU]/mL (ref 0.35–5.50)

## 2022-04-29 LAB — HEMOGLOBIN A1C: Hgb A1c MFr Bld: 5 % (ref 4.6–6.5)

## 2022-04-29 LAB — VITAMIN D 25 HYDROXY (VIT D DEFICIENCY, FRACTURES): VITD: 39.15 ng/mL (ref 30.00–100.00)

## 2022-04-29 MED ORDER — TRIAMCINOLONE ACETONIDE 0.5 % EX CREA: TOPICAL_CREAM | Freq: Two times a day (BID) | CUTANEOUS | 2 refills | Status: AC

## 2022-04-29 MED ORDER — CYANOCOBALAMIN 1000 MCG/ML IJ SOLN
1000.0000 ug | Freq: Once | INTRAMUSCULAR | Status: AC
  Administered 2022-04-29: 1000 ug via INTRAMUSCULAR

## 2022-04-29 NOTE — Patient Instructions (Addendum)
You had the B12 shot today  Please return once monthly for further B12 shots with the Nurse  Please take all new medication as prescribed - the steroid cream for the hands  Please take OTC Vitamin D3 at 2000 units per day, indefinitely   Please continue all other medications as before, and refills have been done if requested.  Please have the pharmacy call with any other refills you may need.  Please keep your appointments with your specialists as you may have planned  Please go to the LAB at the blood drawing area for the tests to be done  You will be contacted by phone if any changes need to be made immediately.  Otherwise, you will receive a letter about your results with an explanation, but please check with MyChart first.  Please remember to sign up for MyChart if you have not done so, as this will be important to you in the future with finding out test results, communicating by private email, and scheduling acute appointments online when needed.  Please make an Appointment to return for your 1 year visit, or sooner if needed

## 2022-04-29 NOTE — Assessment & Plan Note (Addendum)
Lab Results  Component Value Date   VITAMINB12 139 (L) 04/03/2021   Low despite oral replaceent,for IM replacement - b12 1000 mcg monthly

## 2022-04-29 NOTE — Assessment & Plan Note (Signed)
Last vitamin D Lab Results  Component Value Date   VD25OH 31.14 04/03/2021   Low, to start oral replacement

## 2022-04-29 NOTE — Progress Notes (Signed)
Patient ID: Connor Morgan, male   DOB: 03/12/1936, 86 y.o.   MRN: [id number]         Chief Complaint:: yearly exam       HPI:  Connor Morgan is a 86 y.o. male here overall dong well.  Declines covid booster and shingrix.  Pt denies chest pain, increased sob or doe, wheezing, orthopnea, PND, increased LE swelling, palpitations, dizziness or syncope.  Pt denies polydipsia, polyuria, or new focal neuro s/s.    Pt denies fever, wt loss, night sweats, loss of appetite, or other constitutional symptoms   Does have worsening eczema to skin of legs in the past month, with marked itching.  Not taking Vit D.  Has not been able to take oral b12 with good absorption, now for restart B12 shots IM monthly.  No other new complaints   Wt Readings from Last 3 Encounters:  04/29/22 171 lb (77.6 kg)  10/03/21 174 lb (78.9 kg)  04/03/21 174 lb 12.8 oz (79.3 kg)   BP Readings from Last 3 Encounters:  04/29/22 126/82  10/03/21 138/68  04/03/21 (!) 176/80   Immunization History  Administered Date(s) Administered   Fluad Quad(high Dose 65+) 09/12/2019, 10/03/2021   Influenza Whole 10/19/2007, 10/09/2008   Influenza, High Dose Seasonal PF 09/21/2017, 10/09/2018   Influenza,inj,Quad PF,6+ Mos 12/22/2013   Influenza-Unspecified 11/03/2002, 10/01/2004, 09/06/2018   Moderna Sars-Covid-2 Vaccination 12/29/2019, 01/24/2020   Pneumococcal Conjugate-13 01/05/2014   Pneumococcal Polysaccharide-23 10/19/2007   Pneumococcal-Unspecified 12/12/2003   Td 12/07/1994, 11/20/2008   Tdap 05/26/2016  There are no preventive care reminders to display for this patient.    Past Medical History:  Diagnosis Date   ALLERGIC RHINITIS 10/19/2007   Aortic atherosclerosis ([place])    BENIGN PROSTATIC HYPERTROPHY 10/19/2007   Cataract    CHEST PAIN, ATYPICAL 12/03/2008   DYSPNEA 11/20/2008   ERECTILE DYSFUNCTION 10/19/2007   FATIGUE 10/19/2007   FREQUENCY, URINARY 11/20/2008   Gastric ulcer    GERD 10/19/2007   GLUCOSE  INTOLERANCE 10/19/2007   HEMORRHOIDS, HX OF 08/13/2007   History of kidney stones    History of radiation exposure    [employer]   HOH (hard of hearing)    HYPERLIPIDEMIA 10/19/2007   HYPERTENSION 08/13/2007   Impaired glucose tolerance 07/08/2011   NEPHROLITHIASIS 08/13/2007   NEPHROLITHIASIS, HX OF 10/19/2007   ORCHIECTOMY, HX OF 10/19/2007   Spermatocele 08/13/2007   Past Surgical History:  Procedure Laterality Date   COLONOSCOPY     CYSTOSCOPY     INGUINAL HERNIA REPAIR Bilateral    LITHOTRIPSY     s/p right orchiectomy     benign mass, right   SKIN LESION EXCISION     neck   TONSILLECTOMY     UPPER GI ENDOSCOPY     URETEROSCOPY WITH HOLMIUM LASER LITHOTRIPSY Left 05/20/2020   Procedure: URETEROSCOPY WITH HOLMIUM LASER LITHOTRIPSY/ RETROGRADE/ STENT PLACEMENT;  Surgeon: Raynelle Bring, MD;  Location: [facility];  Service: Urology;  Laterality: Left;  ONLY NEEDS 60 MIN    reports that he has quit smoking. He has never used smokeless tobacco. He reports that he does not currently use alcohol. He reports that he does not use drugs. family history includes Dementia in his father and mother; Heart disease in his brother and father; Kidney disease in his mother. Allergies  Allergen Reactions   Mevacor [Lovastatin]     REACTION: myalgias   Current Outpatient Medications on File Prior to Visit  Medication Sig Dispense Refill   amLODipine (  NORVASC) 5 MG tablet Take 1 tablet by mouth once daily 90 tablet 3   aspirin 81 MG tablet Take 81 mg by mouth daily.     atorvastatin (LIPITOR) 10 MG tablet Take 1 tablet (10 mg total) by mouth daily. 90 tablet 3   HYDROcodone-acetaminophen (NORCO/VICODIN) 5-325 MG tablet Take 1-2 tablets by mouth every 6 (six) hours.     HYDROcodone-acetaminophen (NORCO/VICODIN) 5-325 MG tablet Take 1-2 tablets by mouth every 6 (six) hours as needed. 15 tablet 0   ondansetron (ZOFRAN) 4 MG tablet Take 4 mg by mouth every 8 (eight) hours as needed.     pantoprazole (PROTONIX)  40 MG tablet Take 1 tablet (40 mg total) by mouth daily. 90 tablet 3   Propylene Glycol 0.6 % SOLN Place 1 drop into both eyes 4 (four) times daily as needed (dry eyes).     tamsulosin (FLOMAX) 0.4 MG CAPS capsule Take 0.4 mg by mouth at bedtime.     vitamin B-12 (CYANOCOBALAMIN) 1000 MCG tablet Take 1 tablet (1,000 mcg total) by mouth daily. 90 tablet 3   No current facility-administered medications on file prior to visit.        ROS:  All others reviewed and negative.  Objective        PE:  BP 126/82 (BP Location: Right Arm, Patient Position: Sitting, Cuff Size: Large)   Pulse 65   Ht '5\' 11"'$  (1.803 m)   Wt 171 lb (77.6 kg)   SpO2 96%   BMI 23.85 kg/m                 Constitutional: Pt appears in NAD               HENT: Head: NCAT.                Right Ear: External ear normal.                 Left Ear: External ear normal.                Eyes: . Pupils are equal, round, and reactive to light. Conjunctivae and EOM are normal               Nose: without d/c or deformity               Neck: Neck supple. Gross normal ROM               Cardiovascular: Normal rate and regular rhythm.                 Pulmonary/Chest: Effort normal and breath sounds without rales or wheezing.                Abd:  Soft, NT, ND, + BS, no organomegaly               Neurological: Pt is alert. At baseline orientation, motor grossly intact               Skin: Skin is warm with eczematous changes to legs below the knees and hands, LE edema - none               Psychiatric: Pt behavior is normal without agitation   Micro: none  Cardiac tracings I have personally interpreted today:  none  Pertinent Radiological findings (summarize): none   Lab Results  Component Value Date   WBC 5.8 04/29/2022   HGB 14.4 04/29/2022   HCT 41.2 04/29/2022  PLT 197.0 04/29/2022   GLUCOSE 85 04/29/2022   CHOL 142 04/29/2022   TRIG 78.0 04/29/2022   HDL 55.60 04/29/2022   LDLDIRECT 128.8 10/19/2007   LDLCALC 71  04/29/2022   ALT 18 04/29/2022   AST 20 04/29/2022   NA 142 04/29/2022   K 3.7 04/29/2022   CL 105 04/29/2022   CREATININE 0.85 04/29/2022   BUN 14 04/29/2022   CO2 32 04/29/2022   TSH 1.99 04/29/2022   PSA 1.96 09/21/2017   HGBA1C 5.0 04/29/2022   Assessment/Plan:  Connor Morgan is a 86 y.o. White or Caucasian [1] male with  has a past medical history of ALLERGIC RHINITIS (10/19/2007), Aortic atherosclerosis ([place]), BENIGN PROSTATIC HYPERTROPHY (10/19/2007), Cataract, CHEST PAIN, ATYPICAL (12/03/2008), DYSPNEA (11/20/2008), ERECTILE DYSFUNCTION (10/19/2007), FATIGUE (10/19/2007), FREQUENCY, URINARY (11/20/2008), Gastric ulcer, GERD (10/19/2007), GLUCOSE INTOLERANCE (10/19/2007), HEMORRHOIDS, HX OF (08/13/2007), History of kidney stones, History of radiation exposure, HOH (hard of hearing), HYPERLIPIDEMIA (10/19/2007), HYPERTENSION (08/13/2007), Impaired glucose tolerance (07/08/2011), NEPHROLITHIASIS (08/13/2007), NEPHROLITHIASIS, HX OF (10/19/2007), ORCHIECTOMY, HX OF (10/19/2007), and Spermatocele (08/13/2007).  Vitamin D deficiency Last vitamin D Lab Results  Component Value Date   VD25OH 31.14 04/03/2021   Low, to start oral replacement   B12 deficiency Lab Results  Component Value Date   VITAMINB12 139 (L) 04/03/2021   Low despite oral replaceent,for IM replacement - b12 1000 mcg monthly   Impaired glucose tolerance Lab Results  Component Value Date   HGBA1C 5.0 04/29/2022   Stable, pt to continue current medical treatment  - diet   HLD (hyperlipidemia) Lab Results  Component Value Date   LDLCALC 71 04/29/2022   Mild uncontrolled, goal ldl < 70, pt to continue current statin lipitor 10 as decliens change for now, for lower chol diet as well   Rash C/w eczema hands and legs - for triam cr prn  Followup: No follow-ups on file.  Cathlean Cower, MD 05/02/2022 5:24 PM [facility]

## 2022-05-02 ENCOUNTER — Encounter: Payer: Self-pay | Admitting: Internal Medicine

## 2022-05-02 NOTE — Assessment & Plan Note (Signed)
C/w eczema hands and legs - for triam cr prn

## 2022-05-02 NOTE — Assessment & Plan Note (Signed)
Lab Results  Component Value Date   LDLCALC 71 04/29/2022   Mild uncontrolled, goal ldl < 70, pt to continue current statin lipitor 10 as decliens change for now, for lower chol diet as well

## 2022-05-02 NOTE — Assessment & Plan Note (Signed)
Lab Results  Component Value Date   HGBA1C 5.0 04/29/2022   Stable, pt to continue current medical treatment  - diet

## 2022-06-03 ENCOUNTER — Ambulatory Visit: Payer: Medicare Other

## 2022-06-10 ENCOUNTER — Other Ambulatory Visit: Payer: Self-pay | Admitting: Internal Medicine

## 2022-06-10 NOTE — Telephone Encounter (Signed)
Please refill as per office routine med refill policy (all routine meds to be refilled for 3 mo or monthly (per pt preference) up to one year from last visit, then month to month grace period for 3 mo, then further med refills will have to be denied) ? ?

## 2022-07-06 DIAGNOSIS — S60222A Contusion of left hand, initial encounter: Secondary | ICD-10-CM | POA: Diagnosis not present

## 2022-07-12 DIAGNOSIS — L03114 Cellulitis of left upper limb: Secondary | ICD-10-CM | POA: Diagnosis not present

## 2022-08-26 ENCOUNTER — Ambulatory Visit (INDEPENDENT_AMBULATORY_CARE_PROVIDER_SITE_OTHER): Payer: Medicare Other | Admitting: Internal Medicine

## 2022-08-26 VITALS — BP 124/66 | HR 68 | Temp 98.7°F | Ht 71.0 in | Wt 164.0 lb

## 2022-08-26 DIAGNOSIS — I10 Essential (primary) hypertension: Secondary | ICD-10-CM

## 2022-08-26 DIAGNOSIS — L0292 Furuncle, unspecified: Secondary | ICD-10-CM

## 2022-08-26 DIAGNOSIS — E559 Vitamin D deficiency, unspecified: Secondary | ICD-10-CM

## 2022-08-26 DIAGNOSIS — R7302 Impaired glucose tolerance (oral): Secondary | ICD-10-CM

## 2022-08-26 DIAGNOSIS — E7800 Pure hypercholesterolemia, unspecified: Secondary | ICD-10-CM

## 2022-08-26 DIAGNOSIS — Z23 Encounter for immunization: Secondary | ICD-10-CM

## 2022-08-26 MED ORDER — DOXYCYCLINE HYCLATE 100 MG PO TABS: 100.0000 mg | ORAL_TABLET | Freq: Two times a day (BID) | ORAL | 0 refills | Status: AC

## 2022-08-26 MED ORDER — MUPIROCIN 2 % EX OINT: 1.0000 | TOPICAL_OINTMENT | Freq: Two times a day (BID) | CUTANEOUS | 0 refills | Status: AC

## 2022-08-26 NOTE — Patient Instructions (Addendum)
You had the flu shot today  Please take all new medication as prescribed - the antibiotic pills and the ointment  Please continue all other medications as before, and refills have been done if requested.  Please have the pharmacy call with any other refills you may need.  Please continue your efforts at being more active, low cholesterol diet, and weight control  Please keep your appointments with your specialists as you may have planned  Please make an Appointment to return in 4 months, or sooner if needed

## 2022-08-26 NOTE — Progress Notes (Signed)
Patient ID: Connor Morgan, male   DOB: 02-11-1936, 86 y.o.   MRN: [id number]        Chief Complaint: follow up recurrent boils, hld, purpura       HPI:  Connor Morgan is a 86 y.o. male here with c/o 2-3 wks recurring boils small to medium sized tender that have come up multiple sites to groin and face.  Pt denies chest pain, increased sob or doe, wheezing, orthopnea, PND, increased LE swelling, palpitations, dizziness or syncope.   Pt denies polydipsia, polyuria, or new focal neuro s/s.   Pt denies fever, wt loss, night sweats, loss of appetite, or other constitutional symptoms  For some reason has stopped the asa and lipitor 10 mg for 2 mo.     No hx of MRSA.  Due for flu shot  Wt Readings from Last 3 Encounters:  08/26/22 164 lb (74.4 kg)  04/29/22 171 lb (77.6 kg)  10/03/21 174 lb (78.9 kg)   BP Readings from Last 3 Encounters:  08/26/22 124/66  04/29/22 126/82  10/03/21 138/68         Past Medical History:  Diagnosis Date   ALLERGIC RHINITIS 10/19/2007   Aortic atherosclerosis ([place])    BENIGN PROSTATIC HYPERTROPHY 10/19/2007   Cataract    CHEST PAIN, ATYPICAL 12/03/2008   DYSPNEA 11/20/2008   ERECTILE DYSFUNCTION 10/19/2007   FATIGUE 10/19/2007   FREQUENCY, URINARY 11/20/2008   Gastric ulcer    GERD 10/19/2007   GLUCOSE INTOLERANCE 10/19/2007   HEMORRHOIDS, HX OF 08/13/2007   History of kidney stones    History of radiation exposure    [employer]   HOH (hard of hearing)    HYPERLIPIDEMIA 10/19/2007   HYPERTENSION 08/13/2007   Impaired glucose tolerance 07/08/2011   NEPHROLITHIASIS 08/13/2007   NEPHROLITHIASIS, HX OF 10/19/2007   ORCHIECTOMY, HX OF 10/19/2007   Spermatocele 08/13/2007   Past Surgical History:  Procedure Laterality Date   COLONOSCOPY     CYSTOSCOPY     INGUINAL HERNIA REPAIR Bilateral    LITHOTRIPSY     s/p right orchiectomy     benign mass, right   SKIN LESION EXCISION     neck   TONSILLECTOMY     UPPER GI ENDOSCOPY     URETEROSCOPY WITH HOLMIUM LASER  LITHOTRIPSY Left 05/20/2020   Procedure: URETEROSCOPY WITH HOLMIUM LASER LITHOTRIPSY/ RETROGRADE/ STENT PLACEMENT;  Surgeon: Raynelle Bring, MD;  Location: [facility];  Service: Urology;  Laterality: Left;  ONLY NEEDS 60 MIN    reports that he has quit smoking. He has never used smokeless tobacco. He reports that he does not currently use alcohol. He reports that he does not use drugs. family history includes Dementia in his father and mother; Heart disease in his brother and father; Kidney disease in his mother. Allergies  Allergen Reactions   Mevacor [Lovastatin]     REACTION: myalgias   Current Outpatient Medications on File Prior to Visit  Medication Sig Dispense Refill   amLODipine (NORVASC) 5 MG tablet Take 1 tablet by mouth once daily 90 tablet 3   aspirin 81 MG tablet Take 81 mg by mouth daily.     atorvastatin (LIPITOR) 10 MG tablet Take 1 tablet by mouth once daily 90 tablet 0   HYDROcodone-acetaminophen (NORCO/VICODIN) 5-325 MG tablet Take 1-2 tablets by mouth every 6 (six) hours.     HYDROcodone-acetaminophen (NORCO/VICODIN) 5-325 MG tablet Take 1-2 tablets by mouth every 6 (six) hours as needed. 15 tablet 0   ondansetron (  ZOFRAN) 4 MG tablet Take 4 mg by mouth every 8 (eight) hours as needed.     pantoprazole (PROTONIX) 40 MG tablet Take 1 tablet (40 mg total) by mouth daily. 90 tablet 3   Propylene Glycol 0.6 % SOLN Place 1 drop into both eyes 4 (four) times daily as needed (dry eyes).     tamsulosin (FLOMAX) 0.4 MG CAPS capsule Take 0.4 mg by mouth at bedtime.     triamcinolone cream (KENALOG) 0.5 % Apply topically 2 (two) times daily. 30 g 2   vitamin B-12 (CYANOCOBALAMIN) 1000 MCG tablet Take 1 tablet (1,000 mcg total) by mouth daily. 90 tablet 3   No current facility-administered medications on file prior to visit.        ROS:  All others reviewed and negative.  Objective        PE:  BP 124/66 (BP Location: Left Arm, Patient Position: Sitting, Cuff Size: Large)   Pulse 68    Temp 98.7 F (37.1 C) (Oral)   Ht '5\' 11"'$  (1.803 m)   Wt 164 lb (74.4 kg)   SpO2 96%   BMI 22.87 kg/m                 Constitutional: Pt appears in NAD               HENT: Head: NCAT.                Right Ear: External ear normal.                 Left Ear: External ear normal.                Eyes: . Pupils are equal, round, and reactive to light. Conjunctivae and EOM are normal               Nose: without d/c or deformity               Neck: Neck supple. Gross normal ROM               Cardiovascular: Normal rate and regular rhythm.                 Pulmonary/Chest: Effort normal and breath sounds without rales or wheezing.                Abd:  Soft, NT, ND, + BS, no organomegaly               Neurological: Pt is alert. At baseline orientation, motor grossly intact               Skin: Skin is warm, LE edema - none, face torso and groin with multiple small boil lesions mild tender, non draining               Psychiatric: Pt behavior is normal without agitation   Micro: none  Cardiac tracings I have personally interpreted today:  none  Pertinent Radiological findings (summarize): none   Lab Results  Component Value Date   WBC 5.8 04/29/2022   HGB 14.4 04/29/2022   HCT 41.2 04/29/2022   PLT 197.0 04/29/2022   GLUCOSE 85 04/29/2022   CHOL 142 04/29/2022   TRIG 78.0 04/29/2022   HDL 55.60 04/29/2022   LDLDIRECT 128.8 10/19/2007   LDLCALC 71 04/29/2022   ALT 18 04/29/2022   AST 20 04/29/2022   NA 142 04/29/2022   K 3.7 04/29/2022   CL 105 04/29/2022   CREATININE  0.85 04/29/2022   BUN 14 04/29/2022   CO2 32 04/29/2022   TSH 1.99 04/29/2022   PSA 1.96 09/21/2017   HGBA1C 5.0 04/29/2022   Assessment/Plan:  Connor Morgan is a 86 y.o. White or Caucasian [1] male with  has a past medical history of ALLERGIC RHINITIS (10/19/2007), Aortic atherosclerosis ([place]), BENIGN PROSTATIC HYPERTROPHY (10/19/2007), Cataract, CHEST PAIN, ATYPICAL (12/03/2008), DYSPNEA (11/20/2008), ERECTILE  DYSFUNCTION (10/19/2007), FATIGUE (10/19/2007), FREQUENCY, URINARY (11/20/2008), Gastric ulcer, GERD (10/19/2007), GLUCOSE INTOLERANCE (10/19/2007), HEMORRHOIDS, HX OF (08/13/2007), History of kidney stones, History of radiation exposure, HOH (hard of hearing), HYPERLIPIDEMIA (10/19/2007), HYPERTENSION (08/13/2007), Impaired glucose tolerance (07/08/2011), NEPHROLITHIASIS (08/13/2007), NEPHROLITHIASIS, HX OF (10/19/2007), ORCHIECTOMY, HX OF (10/19/2007), and Spermatocele (08/13/2007).  Boils of multiple sites To left face, left groin and other, small, suggestive of MRSA related, now for mupirocin nasal x 5 days, also doxycyline 100 bid course,  to f/u any worsening symptoms or concerns  Essential hypertension BP Readings from Last 3 Encounters:  08/26/22 124/66  04/29/22 126/82  10/03/21 138/68   Stable, pt to continue medical treatment norvasc 5 mg qd, delcines to restart asa 81 mg  qd   HLD (hyperlipidemia) Lab Results  Component Value Date   LDLCALC 71 04/29/2022   Stable, pt to restart current statin liptior 10 mg qd, low chol diet, f/u next visit lab  Impaired glucose tolerance Lab Results  Component Value Date   HGBA1C 5.0 04/29/2022   Stable, pt to continue current medical treatment  - diet, wt control, excercise   Vitamin D deficiency Last vitamin D Lab Results  Component Value Date   VD25OH 39.15 04/29/2022   Low, to start oral replacement  Followup: No follow-ups on file.  Cathlean Cower, MD 08/28/2022 9:08 PM [facility]

## 2022-08-28 ENCOUNTER — Encounter: Payer: Self-pay | Admitting: Internal Medicine

## 2022-08-28 DIAGNOSIS — L0292 Furuncle, unspecified: Secondary | ICD-10-CM | POA: Insufficient documentation

## 2022-08-28 NOTE — Assessment & Plan Note (Signed)
Lab Results  Component Value Date   HGBA1C 5.0 04/29/2022   Stable, pt to continue current medical treatment  - diet, wt control, excercise

## 2022-08-28 NOTE — Assessment & Plan Note (Signed)
Last vitamin D Lab Results  Component Value Date   VD25OH 39.15 04/29/2022   Low, to start oral replacement

## 2022-08-28 NOTE — Assessment & Plan Note (Addendum)
Lab Results  Component Value Date   LDLCALC 71 04/29/2022   Stable, pt to restart current statin liptior 10 mg qd, low chol diet, f/u next visit lab

## 2022-08-28 NOTE — Assessment & Plan Note (Signed)
BP Readings from Last 3 Encounters:  08/26/22 124/66  04/29/22 126/82  10/03/21 138/68   Stable, pt to continue medical treatment norvasc 5 mg qd, delcines to restart asa 81 mg  qd

## 2022-08-28 NOTE — Assessment & Plan Note (Signed)
To left face, left groin and other, small, suggestive of MRSA related, now for mupirocin nasal x 5 days, also doxycyline 100 bid course,  to f/u any worsening symptoms or concerns

## 2022-09-24 ENCOUNTER — Ambulatory Visit (INDEPENDENT_AMBULATORY_CARE_PROVIDER_SITE_OTHER): Payer: Medicare Other | Admitting: Internal Medicine

## 2022-09-24 VITALS — BP 128/66 | HR 72 | Temp 98.0°F | Ht 71.0 in | Wt 163.0 lb

## 2022-09-24 DIAGNOSIS — I10 Essential (primary) hypertension: Secondary | ICD-10-CM | POA: Diagnosis not present

## 2022-09-24 DIAGNOSIS — R7302 Impaired glucose tolerance (oral): Secondary | ICD-10-CM | POA: Diagnosis not present

## 2022-09-24 DIAGNOSIS — L509 Urticaria, unspecified: Secondary | ICD-10-CM

## 2022-09-24 DIAGNOSIS — E559 Vitamin D deficiency, unspecified: Secondary | ICD-10-CM

## 2022-09-24 DIAGNOSIS — E7800 Pure hypercholesterolemia, unspecified: Secondary | ICD-10-CM

## 2022-09-24 LAB — HEPATIC FUNCTION PANEL
ALT: 16 U/L (ref 0–53)
AST: 19 U/L (ref 0–37)
Albumin: 4.3 g/dL (ref 3.5–5.2)
Alkaline Phosphatase: 99 U/L (ref 39–117)
Bilirubin, Direct: 0.1 mg/dL (ref 0.0–0.3)
Total Bilirubin: 0.6 mg/dL (ref 0.2–1.2)
Total Protein: 6.7 g/dL (ref 6.0–8.3)

## 2022-09-24 LAB — BASIC METABOLIC PANEL
BUN: 17 mg/dL (ref 6–23)
CO2: 32 mEq/L (ref 19–32)
Calcium: 9.5 mg/dL (ref 8.4–10.5)
Chloride: 103 mEq/L (ref 96–112)
Creatinine, Ser: 0.82 mg/dL (ref 0.40–1.50)
GFR: 80.62 mL/min (ref >60.00)
Glucose, Bld: 90 mg/dL (ref 70–99)
Potassium: 3.9 mEq/L (ref 3.5–5.1)
Sodium: 140 mEq/L (ref 135–145)

## 2022-09-24 LAB — CBC WITH DIFFERENTIAL/PLATELET
Basophils Absolute: 0.1 10*3/uL (ref 0.0–0.1)
Basophils Relative: 2 % (ref 0.0–3.0)
Eosinophils Absolute: 0.2 10*3/uL (ref 0.0–0.7)
Eosinophils Relative: 3.2 % (ref 0.0–5.0)
HCT: 40.4 % (ref 39.0–52.0)
Hemoglobin: 14 g/dL (ref 13.0–17.0)
Lymphocytes Relative: 22.6 % (ref 12.0–46.0)
Lymphs Abs: 1.4 10*3/uL (ref 0.7–4.0)
MCHC: 34.6 g/dL (ref 30.0–36.0)
MCV: 91.1 fl (ref 78.0–100.0)
Monocytes Absolute: 0.5 10*3/uL (ref 0.1–1.0)
Monocytes Relative: 7.6 % (ref 3.0–12.0)
Neutro Abs: 3.9 10*3/uL (ref 1.4–7.7)
Neutrophils Relative %: 64.6 % (ref 43.0–77.0)
Platelets: 187 10*3/uL (ref 150.0–400.0)
RBC: 4.43 Mil/uL (ref 4.22–5.81)
RDW: 13.6 % (ref 11.5–15.5)
WBC: 6.1 10*3/uL (ref 4.0–10.5)

## 2022-09-24 LAB — LIPID PANEL
Cholesterol: 143 mg/dL (ref 0–200)
HDL: 56.7 mg/dL (ref >39.00)
LDL Cholesterol: 68 mg/dL (ref 0–99)
NonHDL: 86.4
Total CHOL/HDL Ratio: 3
Triglycerides: 94 mg/dL (ref 0.0–149.0)
VLDL: 18.8 mg/dL (ref 0.0–40.0)

## 2022-09-24 LAB — HEMOGLOBIN A1C: Hgb A1c MFr Bld: 5.1 % (ref 4.6–6.5)

## 2022-09-24 MED ORDER — METHYLPREDNISOLONE ACETATE 80 MG/ML IJ SUSP
80.0000 mg | Freq: Once | INTRAMUSCULAR | Status: AC
  Administered 2022-09-24: 80 mg via INTRAMUSCULAR

## 2022-09-24 NOTE — Progress Notes (Signed)
Patient ID: Connor Morgan, male   DOB: 07/29/1936, 86 y.o.   MRN: [id number]        Chief Complaint: follow up hives, htn, hld, low vit d, hyperglycemia       HPI:  Connor Morgan is a 86 y.o. male here with c/o 1 wk intermittent hive like rash with lesions that come and go and drive him crazy with itching.  No new meds or other OTC.  No prior hx.  Pt denies chest pain, increased sob or doe, wheezing, orthopnea, PND, increased LE swelling, palpitations, dizziness or syncope.   Pt denies polydipsia, polyuria, or new focal neuro s/s. Also states Stools now clay colored, changed for him.  Wife died with liver failure and bile duct malignancy.  Has lost some wt to 163 but not clear why.  BM's only every other day.  Now taking statin and B12 .  Wt Readings from Last 3 Encounters:  09/24/22 163 lb (73.9 kg)  08/26/22 164 lb (74.4 kg)  04/29/22 171 lb (77.6 kg)   BP Readings from Last 3 Encounters:  09/24/22 128/66  08/26/22 124/66  04/29/22 126/82         Past Medical History:  Diagnosis Date   ALLERGIC RHINITIS 10/19/2007   Aortic atherosclerosis ([place])    BENIGN PROSTATIC HYPERTROPHY 10/19/2007   Cataract    CHEST PAIN, ATYPICAL 12/03/2008   DYSPNEA 11/20/2008   ERECTILE DYSFUNCTION 10/19/2007   FATIGUE 10/19/2007   FREQUENCY, URINARY 11/20/2008   Gastric ulcer    GERD 10/19/2007   GLUCOSE INTOLERANCE 10/19/2007   HEMORRHOIDS, HX OF 08/13/2007   History of kidney stones    History of radiation exposure    [employer]   HOH (hard of hearing)    HYPERLIPIDEMIA 10/19/2007   HYPERTENSION 08/13/2007   Impaired glucose tolerance 07/08/2011   NEPHROLITHIASIS 08/13/2007   NEPHROLITHIASIS, HX OF 10/19/2007   ORCHIECTOMY, HX OF 10/19/2007   Spermatocele 08/13/2007   Past Surgical History:  Procedure Laterality Date   COLONOSCOPY     CYSTOSCOPY     INGUINAL HERNIA REPAIR Bilateral    LITHOTRIPSY     s/p right orchiectomy     benign mass, right   SKIN LESION EXCISION     neck   TONSILLECTOMY      UPPER GI ENDOSCOPY     URETEROSCOPY WITH HOLMIUM LASER LITHOTRIPSY Left 05/20/2020   Procedure: URETEROSCOPY WITH HOLMIUM LASER LITHOTRIPSY/ RETROGRADE/ STENT PLACEMENT;  Surgeon: Raynelle Bring, MD;  Location: [facility];  Service: Urology;  Laterality: Left;  ONLY NEEDS 60 MIN    reports that he has quit smoking. He has never used smokeless tobacco. He reports that he does not currently use alcohol. He reports that he does not use drugs. family history includes Dementia in his father and mother; Heart disease in his brother and father; Kidney disease in his mother. Allergies  Allergen Reactions   Mevacor [Lovastatin]     REACTION: myalgias   Current Outpatient Medications on File Prior to Visit  Medication Sig Dispense Refill   amLODipine (NORVASC) 5 MG tablet Take 1 tablet by mouth once daily 90 tablet 3   aspirin 81 MG tablet Take 81 mg by mouth daily.     atorvastatin (LIPITOR) 10 MG tablet Take 1 tablet by mouth once daily 90 tablet 0   doxycycline (VIBRA-TABS) 100 MG tablet Take 1 tablet (100 mg total) by mouth 2 (two) times daily. 20 tablet 0   HYDROcodone-acetaminophen (NORCO/VICODIN) 5-325 MG tablet Take  1-2 tablets by mouth every 6 (six) hours.     HYDROcodone-acetaminophen (NORCO/VICODIN) 5-325 MG tablet Take 1-2 tablets by mouth every 6 (six) hours as needed. 15 tablet 0   ondansetron (ZOFRAN) 4 MG tablet Take 4 mg by mouth every 8 (eight) hours as needed.     pantoprazole (PROTONIX) 40 MG tablet Take 1 tablet (40 mg total) by mouth daily. 90 tablet 3   Propylene Glycol 0.6 % SOLN Place 1 drop into both eyes 4 (four) times daily as needed (dry eyes).     tamsulosin (FLOMAX) 0.4 MG CAPS capsule Take 0.4 mg by mouth at bedtime.     triamcinolone cream (KENALOG) 0.5 % Apply topically 2 (two) times daily. 30 g 2   vitamin B-12 (CYANOCOBALAMIN) 1000 MCG tablet Take 1 tablet (1,000 mcg total) by mouth daily. 90 tablet 3   No current facility-administered medications on file prior to  visit.        ROS:  All others reviewed and negative.  Objective        PE:  BP 128/66 (BP Location: Left Arm, Patient Position: Sitting, Cuff Size: Large)   Pulse 72   Temp 98 F (36.7 C) (Oral)   Ht '5\' 11"'$  (1.803 m)   Wt 163 lb (73.9 kg)   SpO2 97%   BMI 22.73 kg/m                 Constitutional: Pt appears in NAD               HENT: Head: NCAT.                Right Ear: External ear normal.                 Left Ear: External ear normal.                Eyes: . Pupils are equal, round, and reactive to light. Conjunctivae and EOM are normal               Nose: without d/c or deformity               Neck: Neck supple. Gross normal ROM               Cardiovascular: Normal rate and regular rhythm.                 Pulmonary/Chest: Effort normal and breath sounds without rales or wheezing.                Abd:  Soft, NT, ND, + BS, no organomegaly               Neurological: Pt is alert. At baseline orientation, motor grossly intact               Skin: Skin is warm. No rashes, no other new lesions, LE edema - none               Psychiatric: Pt behavior is normal without agitation   Micro: none  Cardiac tracings I have personally interpreted today:  none  Pertinent Radiological findings (summarize): none   Lab Results  Component Value Date   WBC 6.1 09/24/2022   HGB 14.0 09/24/2022   HCT 40.4 09/24/2022   PLT 187.0 09/24/2022   GLUCOSE 90 09/24/2022   CHOL 143 09/24/2022   TRIG 94.0 09/24/2022   HDL 56.70 09/24/2022   LDLDIRECT 128.8 10/19/2007   LDLCALC 68 09/24/2022  ALT 16 09/24/2022   AST 19 09/24/2022   NA 140 09/24/2022   K 3.9 09/24/2022   CL 103 09/24/2022   CREATININE 0.82 09/24/2022   BUN 17 09/24/2022   CO2 32 09/24/2022   TSH 1.99 04/29/2022   PSA 1.96 09/21/2017   HGBA1C 5.1 09/24/2022   Assessment/Plan:  Connor Morgan is a 86 y.o. White or Caucasian [1] male with  has a past medical history of ALLERGIC RHINITIS (10/19/2007), Aortic atherosclerosis  ([place]), BENIGN PROSTATIC HYPERTROPHY (10/19/2007), Cataract, CHEST PAIN, ATYPICAL (12/03/2008), DYSPNEA (11/20/2008), ERECTILE DYSFUNCTION (10/19/2007), FATIGUE (10/19/2007), FREQUENCY, URINARY (11/20/2008), Gastric ulcer, GERD (10/19/2007), GLUCOSE INTOLERANCE (10/19/2007), HEMORRHOIDS, HX OF (08/13/2007), History of kidney stones, History of radiation exposure, HOH (hard of hearing), HYPERLIPIDEMIA (10/19/2007), HYPERTENSION (08/13/2007), Impaired glucose tolerance (07/08/2011), NEPHROLITHIASIS (08/13/2007), NEPHROLITHIASIS, HX OF (10/19/2007), ORCHIECTOMY, HX OF (10/19/2007), and Spermatocele (08/13/2007).  Vitamin D deficiency Last vitamin D Lab Results  Component Value Date   VD25OH 39.15 04/29/2022   Low, reminded to start oral replacement   Impaired glucose tolerance Lab Results  Component Value Date   HGBA1C 5.1 09/24/2022   Stable, pt to continue current medical treatment  - diet, exercise, wt control   HLD (hyperlipidemia) Lab Results  Component Value Date   LDLCALC 68 09/24/2022   Stable, pt to continue current statin lipitor 10 mg qd   Hives Mild to mod, for depomedrol IM 80 mg,  to f/u any worsening symptoms or concerns  Essential hypertension BP Readings from Last 3 Encounters:  09/24/22 128/66  08/26/22 124/66  04/29/22 126/82   Stable, pt to continue medical treatment norvasc 5 mg qd  Followup: Return in about 6 months (around 03/26/2023).  Cathlean Cower, MD 09/27/2022 2:02 PM [facility]

## 2022-09-24 NOTE — Assessment & Plan Note (Signed)
Last vitamin D Lab Results  Component Value Date   VD25OH 39.15 04/29/2022   Low, reminded to start oral replacement

## 2022-09-24 NOTE — Patient Instructions (Signed)
You had the steroid shot today  Please continue all other medications as before, and refills have been done if requested.  Please have the pharmacy call with any other refills you may need.  Please continue your efforts at being more active, low cholesterol diet, and weight control.  Please keep your appointments with your specialists as you may have planned  Please go to the LAB at the blood drawing area for the tests to be done  You will be contacted by phone if any changes need to be made immediately.  Otherwise, you will receive a letter about your results with an explanation, but please check with MyChart first.  Please remember to sign up for MyChart if you have not done so, as this will be important to you in the future with finding out test results, communicating by private email, and scheduling acute appointments online when needed.  Please make an Appointment to return in 6 months, or sooner if needed

## 2022-09-27 ENCOUNTER — Encounter: Payer: Self-pay | Admitting: Internal Medicine

## 2022-09-27 NOTE — Assessment & Plan Note (Signed)
Lab Results  Component Value Date   LDLCALC 68 09/24/2022   Stable, pt to continue current statin lipitor 10 mg qd

## 2022-09-27 NOTE — Assessment & Plan Note (Signed)
Lab Results  Component Value Date   HGBA1C 5.1 09/24/2022   Stable, pt to continue current medical treatment  - diet, exercise, wt control

## 2022-09-27 NOTE — Assessment & Plan Note (Signed)
Mild to mod, for depomedrol IM 80 mg,  to f/u any worsening symptoms or concerns

## 2022-09-27 NOTE — Assessment & Plan Note (Signed)
BP Readings from Last 3 Encounters:  09/24/22 128/66  08/26/22 124/66  04/29/22 126/82   Stable, pt to continue medical treatment norvasc 5 mg qd

## 2022-11-13 ENCOUNTER — Ambulatory Visit: Payer: Medicare Other

## 2023-02-16 ENCOUNTER — Other Ambulatory Visit: Payer: Self-pay | Admitting: Internal Medicine

## 2023-02-23 ENCOUNTER — Ambulatory Visit (INDEPENDENT_AMBULATORY_CARE_PROVIDER_SITE_OTHER): Payer: Medicare Other | Admitting: Internal Medicine

## 2023-02-23 VITALS — BP 150/82 | HR 64 | Temp 97.9°F | Ht 71.0 in | Wt 164.0 lb

## 2023-02-23 DIAGNOSIS — E538 Deficiency of other specified B group vitamins: Secondary | ICD-10-CM | POA: Diagnosis not present

## 2023-02-23 DIAGNOSIS — M25561 Pain in right knee: Secondary | ICD-10-CM | POA: Diagnosis not present

## 2023-02-23 DIAGNOSIS — H2513 Age-related nuclear cataract, bilateral: Secondary | ICD-10-CM | POA: Insufficient documentation

## 2023-02-23 DIAGNOSIS — H35341 Macular cyst, hole, or pseudohole, right eye: Secondary | ICD-10-CM | POA: Insufficient documentation

## 2023-02-23 DIAGNOSIS — I10 Essential (primary) hypertension: Secondary | ICD-10-CM | POA: Diagnosis not present

## 2023-02-23 DIAGNOSIS — H25812 Combined forms of age-related cataract, left eye: Secondary | ICD-10-CM | POA: Insufficient documentation

## 2023-02-23 DIAGNOSIS — R7302 Impaired glucose tolerance (oral): Secondary | ICD-10-CM

## 2023-02-23 DIAGNOSIS — E7800 Pure hypercholesterolemia, unspecified: Secondary | ICD-10-CM | POA: Diagnosis not present

## 2023-02-23 DIAGNOSIS — H268 Other specified cataract: Secondary | ICD-10-CM | POA: Insufficient documentation

## 2023-02-23 DIAGNOSIS — E559 Vitamin D deficiency, unspecified: Secondary | ICD-10-CM | POA: Diagnosis not present

## 2023-02-23 DIAGNOSIS — M25461 Effusion, right knee: Secondary | ICD-10-CM

## 2023-02-23 DIAGNOSIS — Z48810 Encounter for surgical aftercare following surgery on the sense organs: Secondary | ICD-10-CM | POA: Insufficient documentation

## 2023-02-23 LAB — BASIC METABOLIC PANEL
BUN: 18 mg/dL (ref 6–23)
CO2: 29 mEq/L (ref 19–32)
Calcium: 9.6 mg/dL (ref 8.4–10.5)
Chloride: 104 mEq/L (ref 96–112)
Creatinine, Ser: 0.77 mg/dL (ref 0.40–1.50)
GFR: 81.93 mL/min (ref >60.00)
Glucose, Bld: 88 mg/dL (ref 70–99)
Potassium: 3.8 mEq/L (ref 3.5–5.1)
Sodium: 143 mEq/L (ref 135–145)

## 2023-02-23 LAB — CBC WITH DIFFERENTIAL/PLATELET
Basophils Absolute: 0.1 10*3/uL (ref 0.0–0.1)
Basophils Relative: 1.8 % (ref 0.0–3.0)
Eosinophils Absolute: 0.1 10*3/uL (ref 0.0–0.7)
Eosinophils Relative: 2.7 % (ref 0.0–5.0)
HCT: 40.6 % (ref 39.0–52.0)
Hemoglobin: 14.4 g/dL (ref 13.0–17.0)
Lymphocytes Relative: 22.5 % (ref 12.0–46.0)
Lymphs Abs: 1.1 10*3/uL (ref 0.7–4.0)
MCHC: 35.4 g/dL (ref 30.0–36.0)
MCV: 90.7 fl (ref 78.0–100.0)
Monocytes Absolute: 0.4 10*3/uL (ref 0.1–1.0)
Monocytes Relative: 8.4 % (ref 3.0–12.0)
Neutro Abs: 3.3 10*3/uL (ref 1.4–7.7)
Neutrophils Relative %: 64.6 % (ref 43.0–77.0)
Platelets: 188 10*3/uL (ref 150.0–400.0)
RBC: 4.48 Mil/uL (ref 4.22–5.81)
RDW: 13.5 % (ref 11.5–15.5)
WBC: 5 10*3/uL (ref 4.0–10.5)

## 2023-02-23 LAB — URINALYSIS, ROUTINE W REFLEX MICROSCOPIC
Bilirubin Urine: NEGATIVE
Hgb urine dipstick: NEGATIVE
Ketones, ur: NEGATIVE
Leukocytes,Ua: NEGATIVE
Nitrite: NEGATIVE
RBC / HPF: NONE SEEN (ref 0–?)
Specific Gravity, Urine: 1.02 (ref 1.000–1.030)
Total Protein, Urine: NEGATIVE
Urine Glucose: NEGATIVE
Urobilinogen, UA: 0.2 (ref 0.0–1.0)
WBC, UA: NONE SEEN (ref 0–?)
pH: 7 (ref 5.0–8.0)

## 2023-02-23 LAB — LIPID PANEL
Cholesterol: 134 mg/dL (ref 0–200)
HDL: 59.4 mg/dL (ref >39.00)
LDL Cholesterol: 62 mg/dL (ref 0–99)
NonHDL: 74.2
Total CHOL/HDL Ratio: 2
Triglycerides: 61 mg/dL (ref 0.0–149.0)
VLDL: 12.2 mg/dL (ref 0.0–40.0)

## 2023-02-23 LAB — HEPATIC FUNCTION PANEL
ALT: 14 U/L (ref 0–53)
AST: 18 U/L (ref 0–37)
Albumin: 4.2 g/dL (ref 3.5–5.2)
Alkaline Phosphatase: 103 U/L (ref 39–117)
Bilirubin, Direct: 0.1 mg/dL (ref 0.0–0.3)
Total Bilirubin: 0.7 mg/dL (ref 0.2–1.2)
Total Protein: 6.8 g/dL (ref 6.0–8.3)

## 2023-02-23 LAB — HEMOGLOBIN A1C: Hgb A1c MFr Bld: 5 % (ref 4.6–6.5)

## 2023-02-23 LAB — TSH: TSH: 1.43 u[IU]/mL (ref 0.35–5.50)

## 2023-02-23 LAB — VITAMIN D 25 HYDROXY (VIT D DEFICIENCY, FRACTURES): VITD: 28.21 ng/mL — ABNORMAL LOW (ref 30.00–100.00)

## 2023-02-23 LAB — VITAMIN B12: Vitamin B-12: 341 pg/mL (ref 211–911)

## 2023-02-23 MED ORDER — AMLODIPINE BESYLATE 5 MG PO TABS: 5.0000 mg | ORAL_TABLET | Freq: Every day | ORAL | 3 refills | Status: AC

## 2023-02-23 NOTE — Patient Instructions (Addendum)
Please have your Shingrix (shingles) shots done at your local pharmacy.  Please see Sports Medicine on the first floor for the right knee knee pain and swelling  Please continue all other medications as before, and refills have been done if requested.  Please have the pharmacy call with any other refills you may need.  Please continue your efforts at being more active, low cholesterol diet, and weight control.  You are otherwise up to date with prevention measures today.  Please keep your appointments with your specialists as you may have planned  Please go to the LAB at the blood drawing area for the tests to be done  You will be contacted by phone if any changes need to be made immediately.  Otherwise, you will receive a letter about your results with an explanation, but please check with MyChart first.  Please remember to sign up for MyChart if you have not done so, as this will be important to you in the future with finding out test results, communicating by private email, and scheduling acute appointments online when needed.  Please make an Appointment to return in 6 months, or sooner if needed

## 2023-02-27 ENCOUNTER — Encounter: Payer: Self-pay | Admitting: Internal Medicine

## 2023-02-27 NOTE — Progress Notes (Signed)
Patient ID: Connor Morgan, male   DOB: [birth year], 87 y.o.   MRN: [id number]        Chief Complaint: follow up right knee pain and swelling, low B12, htn, hld, hyperglycemia, low vit d       HPI:  Connor Morgan is a 87 y.o. male here with c/o 5 days onset persistent moderate right knee pain and swelling after lifting heavy boxes with moving a friend for several hours. But no giveaways or falls.  More unsteady, but has not had to walk with cane outside the home.  Pt denies chest pain, increased sob or doe, wheezing, orthopnea, PND, increased LE swelling, palpitations, dizziness or syncope.   Pt denies polydipsia, polyuria, or new focal neuro s/s.    Pt denies fever, wt loss, night sweats, loss of appetite, or other constitutional symptoms         Wt Readings from Last 3 Encounters:  02/23/23 164 lb (74.4 kg)  09/24/22 163 lb (73.9 kg)  08/26/22 164 lb (74.4 kg)   BP Readings from Last 3 Encounters:  02/23/23 (!) 150/82  09/24/22 128/66  08/26/22 124/66         Past Medical History:  Diagnosis Date   ALLERGIC RHINITIS 10/19/2007   Aortic atherosclerosis ([place])    BENIGN PROSTATIC HYPERTROPHY 10/19/2007   Cataract    CHEST PAIN, ATYPICAL 12/03/2008   DYSPNEA 11/20/2008   ERECTILE DYSFUNCTION 10/19/2007   FATIGUE 10/19/2007   FREQUENCY, URINARY 11/20/2008   Gastric ulcer    GERD 10/19/2007   GLUCOSE INTOLERANCE 10/19/2007   HEMORRHOIDS, HX OF 08/13/2007   History of kidney stones    History of radiation exposure    [employer]   HOH (hard of hearing)    HYPERLIPIDEMIA 10/19/2007   HYPERTENSION 08/13/2007   Impaired glucose tolerance 07/08/2011   NEPHROLITHIASIS 08/13/2007   NEPHROLITHIASIS, HX OF 10/19/2007   ORCHIECTOMY, HX OF 10/19/2007   Spermatocele 08/13/2007   Past Surgical History:  Procedure Laterality Date   COLONOSCOPY     CYSTOSCOPY     INGUINAL HERNIA REPAIR Bilateral    LITHOTRIPSY     s/p right orchiectomy     benign mass, right   SKIN LESION EXCISION     neck    TONSILLECTOMY     UPPER GI ENDOSCOPY     URETEROSCOPY WITH HOLMIUM LASER LITHOTRIPSY Left 05/20/2020   Procedure: URETEROSCOPY WITH HOLMIUM LASER LITHOTRIPSY/ RETROGRADE/ STENT PLACEMENT;  Surgeon: Raynelle Bring, MD;  Location: [facility];  Service: Urology;  Laterality: Left;  ONLY NEEDS 60 MIN    reports that he has quit smoking. He has never used smokeless tobacco. He reports that he does not currently use alcohol. He reports that he does not use drugs. family history includes Dementia in his father and mother; Heart disease in his brother and father; Kidney disease in his mother. Allergies  Allergen Reactions   Mevacor [Lovastatin]     REACTION: myalgias   Current Outpatient Medications on File Prior to Visit  Medication Sig Dispense Refill   aspirin 81 MG tablet Take 81 mg by mouth daily.     atorvastatin (LIPITOR) 10 MG tablet Take 1 tablet by mouth once daily 90 tablet 2   doxycycline (VIBRA-TABS) 100 MG tablet Take 1 tablet (100 mg total) by mouth 2 (two) times daily. 20 tablet 0   HYDROcodone-acetaminophen (NORCO/VICODIN) 5-325 MG tablet Take 1-2 tablets by mouth every 6 (six) hours.     HYDROcodone-acetaminophen (NORCO/VICODIN) 5-325 MG tablet Take  1-2 tablets by mouth every 6 (six) hours as needed. 15 tablet 0   ondansetron (ZOFRAN) 4 MG tablet Take 4 mg by mouth every 8 (eight) hours as needed.     pantoprazole (PROTONIX) 40 MG tablet Take 1 tablet (40 mg total) by mouth daily. 90 tablet 3   Propylene Glycol 0.6 % SOLN Place 1 drop into both eyes 4 (four) times daily as needed (dry eyes).     tamsulosin (FLOMAX) 0.4 MG CAPS capsule Take 0.4 mg by mouth at bedtime.     triamcinolone cream (KENALOG) 0.5 % Apply topically 2 (two) times daily. 30 g 2   vitamin B-12 (CYANOCOBALAMIN) 1000 MCG tablet Take 1 tablet (1,000 mcg total) by mouth daily. 90 tablet 3   No current facility-administered medications on file prior to visit.        ROS:  All others reviewed and negative.  Objective         PE:  BP (!) 150/82   Pulse 64   Temp 97.9 F (36.6 C) (Oral)   Ht 5\' 11"  (1.803 m)   Wt 164 lb (74.4 kg)   SpO2 96%   BMI 22.87 kg/m                 Constitutional: Pt appears in NAD               HENT: Head: NCAT.                Right Ear: External ear normal.                 Left Ear: External ear normal.                Eyes: . Pupils are equal, round, and reactive to light. Conjunctivae and EOM are normal               Nose: without d/c or deformity               Neck: Neck supple. Gross normal ROM               Cardiovascular: Normal rate and regular rhythm.                 Pulmonary/Chest: Effort normal and breath sounds without rales or wheezing.                Abd:  Soft, NT, ND, + BS, no organomegaly               Neurological: Pt is alert. At baseline orientation, motor grossly intact               Skin: Skin is warm. No rashes, no other new lesions, LE edema - none, right knee with 1+ effusion mild tender, decreased ROM               Psychiatric: Pt behavior is normal without agitation   Micro: none  Cardiac tracings I have personally interpreted today:  none  Pertinent Radiological findings (summarize): none   Lab Results  Component Value Date   WBC 5.0 02/23/2023   HGB 14.4 02/23/2023   HCT 40.6 02/23/2023   PLT 188.0 02/23/2023   GLUCOSE 88 02/23/2023   CHOL 134 02/23/2023   TRIG 61.0 02/23/2023   HDL 59.40 02/23/2023   LDLDIRECT 128.8 10/19/2007   LDLCALC 62 02/23/2023   ALT 14 02/23/2023   AST 18 02/23/2023   NA 143 02/23/2023   K 3.8  02/23/2023   CL 104 02/23/2023   CREATININE 0.77 02/23/2023   BUN 18 02/23/2023   CO2 29 02/23/2023   TSH 1.43 02/23/2023   PSA 1.96 09/21/2017   HGBA1C 5.0 02/23/2023   Assessment/Plan:  Connor Morgan is a 87 y.o. White or Caucasian [1] male with  has a past medical history of ALLERGIC RHINITIS (10/19/2007), Aortic atherosclerosis ([place]), BENIGN PROSTATIC HYPERTROPHY (10/19/2007), Cataract, CHEST PAIN,  ATYPICAL (12/03/2008), DYSPNEA (11/20/2008), ERECTILE DYSFUNCTION (10/19/2007), FATIGUE (10/19/2007), FREQUENCY, URINARY (11/20/2008), Gastric ulcer, GERD (10/19/2007), GLUCOSE INTOLERANCE (10/19/2007), HEMORRHOIDS, HX OF (08/13/2007), History of kidney stones, History of radiation exposure, HOH (hard of hearing), HYPERLIPIDEMIA (10/19/2007), HYPERTENSION (08/13/2007), Impaired glucose tolerance (07/08/2011), NEPHROLITHIASIS (08/13/2007), NEPHROLITHIASIS, HX OF (10/19/2007), ORCHIECTOMY, HX OF (10/19/2007), and Spermatocele (08/13/2007).  Pain and swelling of right knee Moderate recent onset after lifting boxes for several hours, pain and swelling peristent moderate, no falls - cont tylenol prn, also pt to f/u with sport medicine  B12 deficiency Lab Results  Component Value Date   VITAMINB12 341 02/23/2023   Stable, cont oral replacement - b12 IM monthly   Essential hypertension BP Readings from Last 3 Encounters:  02/23/23 (!) 150/82  09/24/22 128/66  08/26/22 124/66   Uncontrolled, likely reactive,, pt to continue medical treatment norvasc 5 mg qd   HLD (hyperlipidemia) Lab Results  Component Value Date   LDLCALC 62 02/23/2023   Stable, pt to continue current statin lipitor 10 mg qd   Impaired glucose tolerance Lab Results  Component Value Date   HGBA1C 5.0 02/23/2023   Stable, pt to continue current medical treatment  - diet, wt control   Vitamin D deficiency Last vitamin D Lab Results  Component Value Date   VD25OH 28.21 (L) 02/23/2023   Low, to start oral replacement  Followup: Return in about 6 months (around 08/26/2023).  Cathlean Cower, MD 02/27/2023 2:44 PM [facility]

## 2023-02-27 NOTE — Assessment & Plan Note (Signed)
Last vitamin D Lab Results  Component Value Date   VD25OH 28.21 (L) 02/23/2023   Low, to start oral replacement

## 2023-02-27 NOTE — Assessment & Plan Note (Signed)
Lab Results  Component Value Date   HGBA1C 5.0 02/23/2023   Stable, pt to continue current medical treatment  - diet, wt control

## 2023-02-27 NOTE — Assessment & Plan Note (Addendum)
Lab Results  Component Value Date   VITAMINB12 341 02/23/2023   Stable, cont oral replacement - b12 IM monthly

## 2023-02-27 NOTE — Assessment & Plan Note (Signed)
Lab Results  Component Value Date   LDLCALC 62 02/23/2023   Stable, pt to continue current statin lipitor 10 mg qd

## 2023-02-27 NOTE — Assessment & Plan Note (Signed)
BP Readings from Last 3 Encounters:  02/23/23 (!) 150/82  09/24/22 128/66  08/26/22 124/66   Uncontrolled, likely reactive,, pt to continue medical treatment norvasc 5 mg qd

## 2023-02-27 NOTE — Assessment & Plan Note (Signed)
Moderate recent onset after lifting boxes for several hours, pain and swelling peristent moderate, no falls - cont tylenol prn, also pt to f/u with sport medicine

## 2023-03-26 ENCOUNTER — Ambulatory Visit: Payer: Medicare Other | Admitting: Internal Medicine

## 2023-04-19 ENCOUNTER — Ambulatory Visit: Payer: Medicare Other | Admitting: Internal Medicine
